# Patient Record
Sex: Female | Born: 1940 | Race: White | Hispanic: No | Smoking: Former smoker
Health system: Southern US, Community
[De-identification: ages and names within clinical notes are randomized; demographics above are authoritative.]

## PROBLEM LIST (undated history)

## (undated) DIAGNOSIS — Z923 Personal history of irradiation: Secondary | ICD-10-CM

## (undated) DIAGNOSIS — C50219 Malignant neoplasm of upper-inner quadrant of unspecified female breast: Secondary | ICD-10-CM

## (undated) DIAGNOSIS — F329 Major depressive disorder, single episode, unspecified: Secondary | ICD-10-CM

## (undated) DIAGNOSIS — F32A Depression, unspecified: Secondary | ICD-10-CM

## (undated) DIAGNOSIS — Z8679 Personal history of other diseases of the circulatory system: Secondary | ICD-10-CM

## (undated) DIAGNOSIS — R351 Nocturia: Secondary | ICD-10-CM

## (undated) DIAGNOSIS — R350 Frequency of micturition: Secondary | ICD-10-CM

## (undated) DIAGNOSIS — E785 Hyperlipidemia, unspecified: Secondary | ICD-10-CM

## (undated) DIAGNOSIS — R002 Palpitations: Secondary | ICD-10-CM

## (undated) DIAGNOSIS — Z853 Personal history of malignant neoplasm of breast: Secondary | ICD-10-CM

## (undated) DIAGNOSIS — C50919 Malignant neoplasm of unspecified site of unspecified female breast: Secondary | ICD-10-CM

## (undated) HISTORY — DX: Depression, unspecified: F32.A

## (undated) HISTORY — DX: Major depressive disorder, single episode, unspecified: F32.9

## (undated) HISTORY — DX: Hyperlipidemia, unspecified: E78.5

## (undated) HISTORY — DX: Malignant neoplasm of upper-inner quadrant of unspecified female breast: C50.219

---
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
## 1978-04-01 HISTORY — PX: HYSTEROSCOPY W/ ENDOMETRIAL ABLATION: SUR665

## 1998-12-14 ENCOUNTER — Ambulatory Visit (HOSPITAL_COMMUNITY): Admission: RE | Admit: 1998-12-14 | Discharge: 1998-12-14 | Payer: Self-pay | Admitting: Family Medicine

## 1998-12-14 ENCOUNTER — Encounter: Payer: Self-pay | Admitting: Family Medicine

## 2002-07-12 ENCOUNTER — Encounter: Payer: Self-pay | Admitting: Family Medicine

## 2002-07-12 ENCOUNTER — Encounter: Admission: RE | Admit: 2002-07-12 | Discharge: 2002-07-12 | Payer: Self-pay | Admitting: Family Medicine

## 2003-08-22 ENCOUNTER — Other Ambulatory Visit: Admission: RE | Admit: 2003-08-22 | Discharge: 2003-08-22 | Payer: Self-pay | Admitting: Family Medicine

## 2004-10-09 ENCOUNTER — Other Ambulatory Visit: Admission: RE | Admit: 2004-10-09 | Discharge: 2004-10-09 | Payer: Self-pay | Admitting: Family Medicine

## 2006-03-03 ENCOUNTER — Other Ambulatory Visit: Admission: RE | Admit: 2006-03-03 | Discharge: 2006-03-03 | Payer: Self-pay | Admitting: Family Medicine

## 2007-11-23 ENCOUNTER — Encounter: Payer: Self-pay | Admitting: Pulmonary Disease

## 2007-12-21 ENCOUNTER — Ambulatory Visit: Payer: Self-pay | Admitting: Pulmonary Disease

## 2007-12-21 DIAGNOSIS — R05 Cough: Secondary | ICD-10-CM

## 2007-12-21 DIAGNOSIS — E785 Hyperlipidemia, unspecified: Secondary | ICD-10-CM

## 2007-12-31 ENCOUNTER — Other Ambulatory Visit: Admission: RE | Admit: 2007-12-31 | Discharge: 2007-12-31 | Payer: Self-pay | Admitting: Family Medicine

## 2008-01-11 ENCOUNTER — Ambulatory Visit: Payer: Self-pay | Admitting: Pulmonary Disease

## 2008-08-26 ENCOUNTER — Encounter: Admission: RE | Admit: 2008-08-26 | Discharge: 2008-08-26 | Payer: Self-pay | Admitting: Family Medicine

## 2010-04-01 DIAGNOSIS — C50919 Malignant neoplasm of unspecified site of unspecified female breast: Secondary | ICD-10-CM

## 2010-04-01 DIAGNOSIS — Z923 Personal history of irradiation: Secondary | ICD-10-CM

## 2010-04-01 HISTORY — PX: BREAST LUMPECTOMY: SHX2

## 2010-04-01 HISTORY — DX: Personal history of irradiation: Z92.3

## 2010-04-01 HISTORY — DX: Malignant neoplasm of unspecified site of unspecified female breast: C50.919

## 2010-11-06 ENCOUNTER — Other Ambulatory Visit: Payer: Self-pay | Admitting: Family Medicine

## 2010-11-06 DIAGNOSIS — N6009 Solitary cyst of unspecified breast: Secondary | ICD-10-CM

## 2010-11-21 ENCOUNTER — Other Ambulatory Visit: Payer: Self-pay | Admitting: Family Medicine

## 2010-11-21 ENCOUNTER — Ambulatory Visit
Admission: RE | Admit: 2010-11-21 | Discharge: 2010-11-21 | Disposition: A | Discharge status: Home / Self Care | Payer: Medicare Other | Source: Ambulatory Visit | Attending: Family Medicine | Admitting: Family Medicine

## 2010-11-21 ENCOUNTER — Other Ambulatory Visit: Payer: Self-pay | Admitting: Diagnostic Radiology

## 2010-11-21 DIAGNOSIS — N6320 Unspecified lump in the left breast, unspecified quadrant: Secondary | ICD-10-CM

## 2010-11-21 DIAGNOSIS — C50219 Malignant neoplasm of upper-inner quadrant of unspecified female breast: Secondary | ICD-10-CM

## 2010-11-21 DIAGNOSIS — N6310 Unspecified lump in the right breast, unspecified quadrant: Secondary | ICD-10-CM

## 2010-11-21 DIAGNOSIS — C50211 Malignant neoplasm of upper-inner quadrant of right female breast: Secondary | ICD-10-CM | POA: Insufficient documentation

## 2010-11-21 DIAGNOSIS — N6009 Solitary cyst of unspecified breast: Secondary | ICD-10-CM

## 2010-11-21 HISTORY — DX: Malignant neoplasm of upper-inner quadrant of unspecified female breast: C50.219

## 2010-11-23 ENCOUNTER — Ambulatory Visit
Admission: RE | Admit: 2010-11-23 | Discharge: 2010-11-23 | Disposition: A | Discharge status: Home / Self Care | Payer: Medicare Other | Source: Ambulatory Visit | Attending: Family Medicine | Admitting: Family Medicine

## 2010-11-23 ENCOUNTER — Other Ambulatory Visit: Payer: Self-pay | Admitting: Family Medicine

## 2010-11-23 DIAGNOSIS — C50911 Malignant neoplasm of unspecified site of right female breast: Secondary | ICD-10-CM

## 2010-11-23 DIAGNOSIS — N6320 Unspecified lump in the left breast, unspecified quadrant: Secondary | ICD-10-CM

## 2010-11-27 ENCOUNTER — Ambulatory Visit
Admission: RE | Admit: 2010-11-27 | Discharge: 2010-11-27 | Disposition: A | Discharge status: Home / Self Care | Payer: Medicare Other | Source: Ambulatory Visit | Attending: Family Medicine | Admitting: Family Medicine

## 2010-11-27 DIAGNOSIS — C50911 Malignant neoplasm of unspecified site of right female breast: Secondary | ICD-10-CM

## 2010-11-27 MED ORDER — GADOBENATE DIMEGLUMINE 529 MG/ML IV SOLN
12.0000 mL | Freq: Once | INTRAVENOUS | Status: AC | PRN
  Administered 2010-11-27: 12 mL via INTRAVENOUS

## 2010-12-02 ENCOUNTER — Encounter (INDEPENDENT_AMBULATORY_CARE_PROVIDER_SITE_OTHER): Payer: Self-pay | Admitting: Surgery

## 2010-12-05 ENCOUNTER — Ambulatory Visit (INDEPENDENT_AMBULATORY_CARE_PROVIDER_SITE_OTHER): Payer: Medicare Other | Admitting: Surgery

## 2010-12-05 ENCOUNTER — Encounter (INDEPENDENT_AMBULATORY_CARE_PROVIDER_SITE_OTHER): Payer: Self-pay | Admitting: Surgery

## 2010-12-05 ENCOUNTER — Other Ambulatory Visit (INDEPENDENT_AMBULATORY_CARE_PROVIDER_SITE_OTHER): Payer: Self-pay | Admitting: Surgery

## 2010-12-05 VITALS — BP 98/56 | HR 58 | Temp 97.0°F | Ht 65.0 in | Wt 131.0 lb

## 2010-12-05 DIAGNOSIS — C50911 Malignant neoplasm of unspecified site of right female breast: Secondary | ICD-10-CM

## 2010-12-05 DIAGNOSIS — C50919 Malignant neoplasm of unspecified site of unspecified female breast: Secondary | ICD-10-CM

## 2010-12-05 DIAGNOSIS — C50219 Malignant neoplasm of upper-inner quadrant of unspecified female breast: Secondary | ICD-10-CM

## 2010-12-05 NOTE — Patient Instructions (Signed)
We will schedule you for surgery a lumpectomy and sentinel node. In the meantime we will try to arrange a visit with a medical and a radiation oncologist. If you have not heard from the cancer center by Monday call my nurse Jade at [phone]

## 2010-12-05 NOTE — Progress Notes (Signed)
NAME: [name]                                                                                      DOB: 08/07/1940 DATE: 12/05/2010               MRN: [id number]   CC:  Chief Complaint  Patient presents with  . Other    eval of right breast cancer     HPI: [name] is a 70 y.o.  female who presents with A newly diagnosed right breast cancer. The patient notes that she missed mammograms for about two years when her sister was diagnosed pancreatic cancer. She recently found a mass in the lateral aspect of the right breast, actually upper outer quadrant, and saw her medical doctor. A mammogram was done which showed an abnormality, biopsy was done which showed cancer, and an MRI was done which showed only the single lesion.  She's not had any other breast symptoms she did have a biopsy is soft and benign when she was 70 years old from the other breast. I have taken care of some of her friends and family members and she asked to see me for this although was originally scheduled to be seen at the breast multidisciplinary conference  PMH:  has a past medical history of Hyperlipidemia; Fatigue; Weight decrease; Blurred vision; Gum disease; Irregular heart beat; Poor appetite; Bruises easily; Breast lump; Forgetfulness; Weakness; and Depression.  PSH:  has past surgical history that includes Breast surgery (1962).  ALLERGIES:  Allergies  Allergen Reactions  . Penicillins     MEDICATIONS:  Current Outpatient Prescriptions  Medication Sig Dispense Refill  . pravastatin (PRAVACHOL) 40 MG tablet Take 40 mg by mouth daily.          ROS;Her review of systems is essentially negative except for a questionable history of gum disease blurred vision. Her heart beat poor appetite urinary symptoms easy bruisability and some depression.  EXAM: GENERAL: The patient is alert, oriented, and generally healthy-appearing, NAD. Mood and affect are normal.  HEENT: The head is normocephalic, the eyes  nonicteric, the pupils were round regular and equal. EOMs are normal. Pharynx normal. Dentition good.  NECK: The neck is supple and there are no masses or thyromegaly.  LUNGS: Normal respirations and clear to auscultation.  HEART: Regular rhythm, with no murmurs rubs or gallops. Pulses are intact carotid dorsalis pedis and posterior tibial. No significant varicosities are noted.  BREASTS: The breasts are symmetric in size and shape. There is ecchymosis in the upper outer quadrant of the right breast and a vague mass about 2 cm in size in the upper outer quadrant. The serious fairly hard I can tell bruising from actual mass.  Lymphatics: There is no axillary or supraclavicular  ABDOMEN: Soft, flat, and nontender. No masses or organomegaly is noted. No hernias are noted. Bowel sounds are normal.  EXTREMITIES: Good range of motion, no edema.  DATA REVIEWED: I have reviewed the mammogram films and reports, the MRI films and reports, the pathology report and slides. She was discussed with a breast conference last week.  IMPRESSION: Clinical Stage II Right breast  cancer, UOQ PLAN: I reviewed the patient's diagnosis with her and went over the different types of breast cancer and different treatment options including lumpectomy mastectomy sentinel node evaluation etc. I told her about the need for radiation therapy and the possible need for chemotherapy I discussed the fact that this is receptor negative.  I think all questions have been answered. We are going to tentatively set her up for a guidewire localized lumpectomy and sentinel node evaluation. We are also going to make preoperative visits with medical and radiation oncology.

## 2010-12-11 ENCOUNTER — Ambulatory Visit
Admission: RE | Admit: 2010-12-11 | Discharge: 2010-12-11 | Disposition: A | Discharge status: Home / Self Care | Payer: Medicare Other | Source: Ambulatory Visit | Attending: Radiation Oncology | Admitting: Radiation Oncology

## 2010-12-11 DIAGNOSIS — Z800 Family history of malignant neoplasm of digestive organs: Secondary | ICD-10-CM | POA: Insufficient documentation

## 2010-12-11 DIAGNOSIS — E785 Hyperlipidemia, unspecified: Secondary | ICD-10-CM | POA: Insufficient documentation

## 2010-12-11 DIAGNOSIS — Z510 Encounter for antineoplastic radiation therapy: Secondary | ICD-10-CM | POA: Insufficient documentation

## 2010-12-11 DIAGNOSIS — Z79899 Other long term (current) drug therapy: Secondary | ICD-10-CM | POA: Insufficient documentation

## 2010-12-11 DIAGNOSIS — C50219 Malignant neoplasm of upper-inner quadrant of unspecified female breast: Secondary | ICD-10-CM | POA: Insufficient documentation

## 2010-12-31 ENCOUNTER — Encounter (HOSPITAL_COMMUNITY)
Admission: RE | Admit: 2010-12-31 | Discharge: 2010-12-31 | Disposition: A | Discharge status: Home / Self Care | Payer: Medicare Other | Source: Ambulatory Visit | Attending: Surgery | Admitting: Surgery

## 2010-12-31 ENCOUNTER — Other Ambulatory Visit (INDEPENDENT_AMBULATORY_CARE_PROVIDER_SITE_OTHER): Payer: Self-pay | Admitting: Surgery

## 2010-12-31 DIAGNOSIS — Z9889 Other specified postprocedural states: Secondary | ICD-10-CM

## 2010-12-31 LAB — CBC
HCT: 39.2 % (ref 36.0–46.0)
MCH: 31.1 pg (ref 26.0–34.0)
MCV: 89.7 fL (ref 78.0–100.0)
Platelets: 190 10*3/uL (ref 150–400)
RBC: 4.37 MIL/uL (ref 3.87–5.11)
WBC: 6.7 10*3/uL (ref 4.0–10.5)

## 2010-12-31 LAB — URINALYSIS, ROUTINE W REFLEX MICROSCOPIC
Bilirubin Urine: NEGATIVE
Hgb urine dipstick: NEGATIVE
Ketones, ur: 15 mg/dL — AB
Specific Gravity, Urine: 1.025 (ref 1.005–1.030)
pH: 6 (ref 5.0–8.0)

## 2010-12-31 LAB — COMPREHENSIVE METABOLIC PANEL
Albumin: 3.8 g/dL (ref 3.5–5.2)
Alkaline Phosphatase: 75 U/L (ref 39–117)
BUN: 17 mg/dL (ref 6–23)
CO2: 30 mEq/L (ref 19–32)
Chloride: 103 mEq/L (ref 96–112)
Creatinine, Ser: 0.83 mg/dL (ref 0.50–1.10)
GFR calc Af Amer: 82 mL/min — ABNORMAL LOW (ref >90)
GFR calc non Af Amer: 70 mL/min — ABNORMAL LOW (ref >90)
Glucose, Bld: 77 mg/dL (ref 70–99)
Potassium: 3.9 mEq/L (ref 3.5–5.1)
Total Bilirubin: 1.2 mg/dL (ref 0.3–1.2)

## 2010-12-31 LAB — DIFFERENTIAL
Eosinophils Absolute: 0.2 10*3/uL (ref 0.0–0.7)
Eosinophils Relative: 2 % (ref 0–5)
Lymphocytes Relative: 31 % (ref 12–46)
Lymphs Abs: 2.1 10*3/uL (ref 0.7–4.0)
Monocytes Relative: 9 % (ref 3–12)
Neutrophils Relative %: 57 % (ref 43–77)

## 2010-12-31 LAB — SURGICAL PCR SCREEN
MRSA, PCR: NEGATIVE
Staphylococcus aureus: NEGATIVE

## 2011-01-01 ENCOUNTER — Other Ambulatory Visit: Payer: Self-pay | Admitting: Oncology

## 2011-01-01 ENCOUNTER — Encounter (HOSPITAL_BASED_OUTPATIENT_CLINIC_OR_DEPARTMENT_OTHER): Payer: Medicare Other | Admitting: Oncology

## 2011-01-01 DIAGNOSIS — C50419 Malignant neoplasm of upper-outer quadrant of unspecified female breast: Secondary | ICD-10-CM

## 2011-01-01 LAB — COMPREHENSIVE METABOLIC PANEL
AST: 18 U/L (ref 0–37)
Alkaline Phosphatase: 66 U/L (ref 39–117)
BUN: 14 mg/dL (ref 6–23)
Glucose, Bld: 92 mg/dL (ref 70–99)
Sodium: 141 mEq/L (ref 135–145)
Total Bilirubin: 1.1 mg/dL (ref 0.3–1.2)
Total Protein: 6.9 g/dL (ref 6.0–8.3)

## 2011-01-01 LAB — CBC WITH DIFFERENTIAL/PLATELET
EOS%: 2.2 % (ref 0.0–7.0)
Eosinophils Absolute: 0.1 10*3/uL (ref 0.0–0.5)
LYMPH%: 33.7 % (ref 14.0–49.7)
MCH: 32 pg (ref 25.1–34.0)
MCV: 92 fL (ref 79.5–101.0)
MONO%: 6.6 % (ref 0.0–14.0)
NEUT#: 2.7 10*3/uL (ref 1.5–6.5)
Platelets: 175 10*3/uL (ref 145–400)
RBC: 4.03 10*6/uL (ref 3.70–5.45)

## 2011-01-03 ENCOUNTER — Ambulatory Visit
Admission: RE | Admit: 2011-01-03 | Discharge: 2011-01-03 | Disposition: A | Discharge status: Home / Self Care | Payer: Medicare Other | Source: Ambulatory Visit | Attending: Surgery | Admitting: Surgery

## 2011-01-03 ENCOUNTER — Encounter (INDEPENDENT_AMBULATORY_CARE_PROVIDER_SITE_OTHER): Payer: Self-pay | Admitting: Surgery

## 2011-01-03 ENCOUNTER — Ambulatory Visit (HOSPITAL_COMMUNITY)
Admission: RE | Admit: 2011-01-03 | Discharge: 2011-01-03 | Disposition: A | Discharge status: Home / Self Care | Payer: Medicare Other | Source: Ambulatory Visit | Attending: Surgery | Admitting: Surgery

## 2011-01-03 ENCOUNTER — Other Ambulatory Visit (INDEPENDENT_AMBULATORY_CARE_PROVIDER_SITE_OTHER): Payer: Self-pay | Admitting: Surgery

## 2011-01-03 ENCOUNTER — Telehealth (INDEPENDENT_AMBULATORY_CARE_PROVIDER_SITE_OTHER): Payer: Self-pay | Admitting: General Surgery

## 2011-01-03 DIAGNOSIS — C50419 Malignant neoplasm of upper-outer quadrant of unspecified female breast: Secondary | ICD-10-CM | POA: Insufficient documentation

## 2011-01-03 DIAGNOSIS — C50919 Malignant neoplasm of unspecified site of unspecified female breast: Secondary | ICD-10-CM

## 2011-01-03 DIAGNOSIS — Z01810 Encounter for preprocedural cardiovascular examination: Secondary | ICD-10-CM | POA: Insufficient documentation

## 2011-01-03 DIAGNOSIS — Z01818 Encounter for other preprocedural examination: Secondary | ICD-10-CM | POA: Insufficient documentation

## 2011-01-03 DIAGNOSIS — C50911 Malignant neoplasm of unspecified site of right female breast: Secondary | ICD-10-CM

## 2011-01-03 DIAGNOSIS — Z01812 Encounter for preprocedural laboratory examination: Secondary | ICD-10-CM | POA: Insufficient documentation

## 2011-01-03 HISTORY — PX: BREAST LUMPECTOMY W/ NEEDLE LOCALIZATION: SHX1266

## 2011-01-03 MED ORDER — TECHNETIUM TC 99M SULFUR COLLOID FILTERED
1.0000 | Freq: Once | INTRAVENOUS | Status: AC | PRN
  Administered 2011-01-03: 1 via INTRADERMAL

## 2011-01-03 NOTE — Telephone Encounter (Signed)
Message copied by Liliana Cline on Thu Jan 03, 2011  8:58 AM ------      Message from: Currie Paris      Created: Wed Jan 02, 2011  1:24 PM       Sorry should have been ok for surgery

## 2011-01-03 NOTE — Telephone Encounter (Signed)
Labs okay for surgery faxed to pre-op.  

## 2011-01-07 ENCOUNTER — Encounter (INDEPENDENT_AMBULATORY_CARE_PROVIDER_SITE_OTHER): Payer: Self-pay | Admitting: Surgery

## 2011-01-07 ENCOUNTER — Telehealth (INDEPENDENT_AMBULATORY_CARE_PROVIDER_SITE_OTHER): Payer: Self-pay | Admitting: General Surgery

## 2011-01-07 NOTE — Telephone Encounter (Signed)
Patient made aware that her margins were ok and her lymph nodes were negative. Patient will follow up at her scheduled appt and call with any problems prior.

## 2011-01-07 NOTE — Telephone Encounter (Signed)
Message copied by Liliana Cline on Mon Jan 07, 2011  9:35 AM ------      Message from: Currie Paris      Created: Mon Jan 07, 2011  5:57 AM       Tell the patient that her margins are OK and her lymph nodes are negative. I will discuss in detail in the office.

## 2011-01-09 ENCOUNTER — Ambulatory Visit
Admission: RE | Admit: 2011-01-09 | Discharge: 2011-01-09 | Disposition: A | Discharge status: Home / Self Care | Payer: Medicare Other | Source: Ambulatory Visit | Attending: Radiation Oncology | Admitting: Radiation Oncology

## 2011-01-09 DIAGNOSIS — Z171 Estrogen receptor negative status [ER-]: Secondary | ICD-10-CM | POA: Insufficient documentation

## 2011-01-09 DIAGNOSIS — C50919 Malignant neoplasm of unspecified site of unspecified female breast: Secondary | ICD-10-CM | POA: Insufficient documentation

## 2011-01-09 NOTE — Op Note (Signed)
NAMETANISHIA, [name] NO.:  [id number]  MEDICAL RECORD NO.:  [id number]  LOCATION:  [facility]                         FACILITY:  [facility]  PHYSICIAN:  Currie Paris, M.D.DATE OF BIRTH:  08-09-1940  DATE OF PROCEDURE:  01/03/2011 DATE OF DISCHARGE:                              OPERATIVE REPORT   PREOPERATIVE DIAGNOSIS:  Carcinoma of right breast upper outer quadrant, clinical stage I.  POSTOPERATIVE DIAGNOSIS:  Carcinoma of right breast upper outer quadrant, clinical stage I.  PROCEDURE:  Needle guided right lumpectomy with blue dye injection and axillary sentinel lymph node biopsy.  SURGEON:  Currie Paris, MD  ANESTHESIA:  General.  CLINICAL HISTORY:  This patient was recently diagnosed with a right breast cancer.  After discussion with the patient she elected to proceed to do lumpectomy and sentinel node evaluation.  She was seen preoperatively by Medical Oncology and decision for chemo was going to be based on final pathology.  DESCRIPTION OF PROCEDURE:  I saw the patient in the holding area and she had no further questions.  We confirmed the plans for the procedure as noted above.  I initialed the right breast as the operative side and reviewed the localizing films and noted to the radiologist had marked an X on the breast over the area of the tumor.  The patient was taken to the operating room, after satisfactory general (LMA) anesthesia had been obtained, I did the time-out.  The right breast was then injected with 5 mL of dilute methylene blue and massaged in.  A full prep and drape was done.  The area of the X I could palpate what I thought was the mass and it felt fairly close to the skin and on the ultrasound I thought it looked fairly close to the skin, so I made a curvilinear elliptical incision starting just lateral to where the guidewire entered and centered on the "X ".  I then raised a small skin flaps superiorly and inferiorly  and then divided the breast tissue towards the chest wall first superiorly, then medially and then inferiorly, and I got down to the chest wall in all three sides, I was able to come underneath the mass and finally disconnected laterally.  By palpation I was around the mass in all directions and I thought I had adequate margins grossly.  Special mammogram showed the mass to be apparently in the center of the specimen.  I spent several minutes irrigating, making sure everything was dry.  I freed the breast up from the underlying muscle and put a small clips on to mark all of the margins using two clips on the muscle.  I closed the breast deep and then a little bit more superficial.  I elevated the skin off of the breast so that there was less tethering towards the nipple-areolar complex which was being pulled a little bit up because of the skin that I had taken off.  The skin was then closed with 4-0 Monocryl subcuticular and Dermabond.  Attention was turned to the axilla and a hot area identified.  I made a transverse incision, divided the subcutaneous tissue and placed a self- retaining retractor.  I almost medially identified a blue lymphatic which took me to a blue lymph node which was excised with cautery and I thought the tissue actually contained 2 separate nodes, although I was not sure this was not one somewhat dumbbell shaped node.  Both were bright blue and had counts of about 1200.  The tissue was soft and I thought clinically negative.  Attention was turned back to the axilla and I palpated carefully, did not feel any other enlarged lymph nodes, did not see any other blue dye and there were no hot areas.  I put about 10 mL of Marcaine in here and closed with 3-0 Vicryl for Monocryl, subcuticular and Dermabond.  The patient tolerated the procedure well.  There were no complications. All counts were correct.     Currie Paris, M.D.     CJS/MEDQ  D:   01/03/2011  T:  01/03/2011  Job:  [id number]  cc:   Molly Maduro A. Nicholos Johns, M.D. Barbaraann Share, MD,FCCP Lowella Dell, M.D.  Electronically Signed by Cyndia Bent M.D. on 01/09/2011 07:14:40 PM

## 2011-01-11 ENCOUNTER — Telehealth (INDEPENDENT_AMBULATORY_CARE_PROVIDER_SITE_OTHER): Payer: Self-pay | Admitting: General Surgery

## 2011-01-11 NOTE — Telephone Encounter (Signed)
Called patient back to make her appt. I have her scheduled for 01/22/11. Patient has radiation mapping on 01/21/11 because she has refused chemo so she is continuing with radiation. She will call with any problems and follow up at scheduled appt.

## 2011-01-15 ENCOUNTER — Other Ambulatory Visit (HOSPITAL_COMMUNITY)
Admission: RE | Admit: 2011-01-15 | Discharge: 2011-01-15 | Disposition: A | Discharge status: Home / Self Care | Payer: Medicare Other | Source: Ambulatory Visit | Attending: Family Medicine | Admitting: Family Medicine

## 2011-01-15 ENCOUNTER — Other Ambulatory Visit: Payer: Self-pay | Admitting: Family Medicine

## 2011-01-15 DIAGNOSIS — Z124 Encounter for screening for malignant neoplasm of cervix: Secondary | ICD-10-CM | POA: Insufficient documentation

## 2011-01-24 ENCOUNTER — Ambulatory Visit (INDEPENDENT_AMBULATORY_CARE_PROVIDER_SITE_OTHER): Payer: Medicare Other | Admitting: Surgery

## 2011-01-24 ENCOUNTER — Encounter (INDEPENDENT_AMBULATORY_CARE_PROVIDER_SITE_OTHER): Payer: Self-pay | Admitting: Surgery

## 2011-01-24 VITALS — BP 92/60 | HR 64 | Temp 99.1°F | Resp 12 | Ht 65.0 in | Wt 130.6 lb

## 2011-01-24 DIAGNOSIS — C50219 Malignant neoplasm of upper-inner quadrant of unspecified female breast: Secondary | ICD-10-CM

## 2011-01-24 NOTE — Progress Notes (Signed)
[name]    [id number] 01/24/2011    07-10-1940   CC: Post op lumpectomy/SLN  HPI: The patient returns for post op follow-up. She underwent a right lumpectomy and SLN on 01/03/2011. Over all she feels that she is doing well. She is ready to start radiation  PE: The incision is healing nicely and there is no evidence of infection or hematoma.  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 One SLN -> negative  IMPRESSION: Patient doing well.   PLAN: Her next visit will be in about two months, when she has finished radiation.Marland Kitchen

## 2011-01-24 NOTE — Patient Instructions (Signed)
I need to see you about three weeks after you finish your radiation

## 2011-01-29 ENCOUNTER — Encounter (INDEPENDENT_AMBULATORY_CARE_PROVIDER_SITE_OTHER): Payer: Self-pay | Admitting: Surgery

## 2011-01-29 ENCOUNTER — Ambulatory Visit (INDEPENDENT_AMBULATORY_CARE_PROVIDER_SITE_OTHER): Payer: Medicare Other | Admitting: Surgery

## 2011-01-29 VITALS — BP 88/60 | HR 60 | Temp 97.4°F | Resp 12 | Ht 65.0 in | Wt 130.4 lb

## 2011-01-29 DIAGNOSIS — C50219 Malignant neoplasm of upper-inner quadrant of unspecified female breast: Secondary | ICD-10-CM

## 2011-01-29 MED ORDER — DOXYCYCLINE HYCLATE 100 MG PO CAPS: 100.0000 mg | ORAL_CAPSULE | Freq: Two times a day (BID) | ORAL | Status: AC

## 2011-01-29 NOTE — Patient Instructions (Signed)
Begin your antibiotics today.  Follow-up with Dr. Jamey Ripa in 3 days.

## 2011-01-29 NOTE — Progress Notes (Signed)
URGENT OFFICE  Patient of Dr. Jamey Ripa - s/p right lumpectomy SLNB on 01/03/11.  She was scheduled to start radiation today, but several days ago, she began noticing some tenderness behind her right nipple and some mild erythema.  The right breast feels "heavy" compared to the left breast.  Dr. Dayton Scrape sent her over to be evaluated.  Filed Vitals:   01/29/11 1555  BP: 88/60  Pulse: 60  Temp: 97.4 F (36.3 C)  Resp: 12    On examination, the patient has a well-healed incision in the right upper outer quadrant.  There is some mild erythema around the nipple extending into the lower inner quadrant.  There seems to be some fullness to the lumpectomy site.  I attempted to aspirate a possible seroma, but I was unable to obtain any fluid.  We will start her on an empiric course of Doxycycline, as she is allergic to penicillin.  I will have her come back to see Dr. Jamey Ripa on Friday.

## 2011-01-30 ENCOUNTER — Encounter: Payer: Self-pay | Admitting: *Deleted

## 2011-01-31 ENCOUNTER — Encounter (INDEPENDENT_AMBULATORY_CARE_PROVIDER_SITE_OTHER): Payer: Self-pay | Admitting: Surgery

## 2011-01-31 ENCOUNTER — Ambulatory Visit (INDEPENDENT_AMBULATORY_CARE_PROVIDER_SITE_OTHER): Payer: Medicare Other | Admitting: Surgery

## 2011-01-31 VITALS — BP 92/64 | HR 68 | Temp 97.6°F | Resp 14 | Ht 65.0 in | Wt 130.0 lb

## 2011-01-31 DIAGNOSIS — C50219 Malignant neoplasm of upper-inner quadrant of unspecified female breast: Secondary | ICD-10-CM

## 2011-01-31 NOTE — Patient Instructions (Signed)
Try takin two alleve twice daily for a few days, then one twice daily. Call me on Monday to let me know how you are doing

## 2011-01-31 NOTE — Progress Notes (Signed)
The patient comes back for followup. She developed some redness of her breast and pain around the nipple areolar area and the redness extending from their medially. She had a lumpectomy in the upper outer quadrant of the right breast.  She saw my partner, Dr.Tsuei, a few days ago and he started her on some doxycycline 100 mg p.o. b.i.d. she notes that she is having a little bit less pain and tenderness since that started. She is not having any fever.  Exam: There is erythema of the breast extending from the nipple areola complex medially towards the sternum. There is not appear to be any fluid collections. Both the lumpectomy and the lymph node incisions are healing nicely. She is tender especially in the nipple areolar area.  Impression: Mild erythema of the breast of uncertain etiology. I don't know if this is related to her sentinel node injection or some development of cellulitis. Nevertheless, it is slightly improved on doxycycline.  Plan: She'll continue doxycycline and common Monday to let me know how she is doing. I recommended that she start taking Aleve, two b.i.d., for a few days and then one b.i.d.

## 2011-02-04 ENCOUNTER — Encounter (INDEPENDENT_AMBULATORY_CARE_PROVIDER_SITE_OTHER): Payer: Self-pay | Admitting: Surgery

## 2011-02-04 ENCOUNTER — Ambulatory Visit: Payer: Medicare Other

## 2011-02-04 ENCOUNTER — Ambulatory Visit (INDEPENDENT_AMBULATORY_CARE_PROVIDER_SITE_OTHER): Payer: Medicare Other | Admitting: Surgery

## 2011-02-04 VITALS — BP 112/76 | HR 68 | Temp 97.4°F | Resp 16 | Ht 65.0 in | Wt 131.0 lb

## 2011-02-04 DIAGNOSIS — Z9889 Other specified postprocedural states: Secondary | ICD-10-CM

## 2011-02-04 MED ORDER — LEVOFLOXACIN 250 MG PO TABS: 250.0000 mg | ORAL_TABLET | Freq: Every day | ORAL | Status: DC

## 2011-02-04 NOTE — Progress Notes (Signed)
The patient comes back for followup. She developed some redness of her breast and pain around the nipple areolar area and the redness extending from their medially. She had a lumpectomy in the upper outer quadrant of the right breast.  She saw me a few days ago and thought it was improving . She hasn't really improved since I saw her. She is on Doxy. Exam: There is erythema of the breast extending from the nipple areola complex medially towards the sternum. There is not appear to be any fluid collections. Both the lumpectomy and the lymph node incisions are healing nicely. She is tender especially in the nipple areolar area.The erythema seems less than last week  Impression: Mild erythema of the breast of uncertain etiology. I don't know if this is related to her sentinel node injection or some development of cellulitis. Nevertheless, it is slightly improved on doxycycline.  Plan: We will try Levoquin. I think she can start her radiation

## 2011-02-04 NOTE — Patient Instructions (Signed)
We will change your antibiotic to Levoquin generic. I need to recheck you next week

## 2011-02-05 ENCOUNTER — Ambulatory Visit: Payer: Medicare Other

## 2011-02-11 ENCOUNTER — Ambulatory Visit: Payer: Medicare Other

## 2011-02-12 ENCOUNTER — Ambulatory Visit (INDEPENDENT_AMBULATORY_CARE_PROVIDER_SITE_OTHER): Payer: Medicare Other | Admitting: Surgery

## 2011-02-12 ENCOUNTER — Ambulatory Visit: Payer: Medicare Other

## 2011-02-12 ENCOUNTER — Encounter (INDEPENDENT_AMBULATORY_CARE_PROVIDER_SITE_OTHER): Payer: Self-pay | Admitting: Surgery

## 2011-02-12 VITALS — BP 98/60 | HR 60 | Temp 97.6°F | Resp 16 | Ht 65.0 in | Wt 133.0 lb

## 2011-02-12 DIAGNOSIS — Z09 Encounter for follow-up examination after completed treatment for conditions other than malignant neoplasm: Secondary | ICD-10-CM

## 2011-02-12 MED ORDER — LEVOFLOXACIN 250 MG PO TABS: 250.0000 mg | ORAL_TABLET | Freq: Every day | ORAL | Status: AC

## 2011-02-12 NOTE — Progress Notes (Signed)
The patient comes back for followup. She developed some redness of her breast and pain around the nipple areolar area and the redness extending from their medially. She had a lumpectomy in the upper outer quadrant of the right breast.  She improved on Levaquin and two days ago was not tender or red. However, she has been off the med for two days and today the erythema and discomfort have returned. Exam: There is erythema of the breast extending from the nipple areola complex medially towards the sternum. There does not appear to be any fluid collections. Both the lumpectomy and the lymph node incisions are healing nicely. She is tender especially in the nipple areolar area.  I did an ultrasound and there is no significant fluid collection or abscess Impression: Mild erythema of the breast of uncertain etiology Since it responded to Levaquin it may be a low grade cellulitis, but she has never had systemic symptoms Plan:We will give another course of Levaquin, this time for two weeks. I still think she can start radiation

## 2011-02-12 NOTE — Patient Instructions (Signed)
You should restart the Levaquin and continue it for too weeks. I think it is OK to start radiation

## 2011-02-13 ENCOUNTER — Ambulatory Visit
Admission: RE | Admit: 2011-02-13 | Discharge: 2011-02-13 | Disposition: A | Discharge status: Home / Self Care | Payer: Medicare Other | Source: Ambulatory Visit | Attending: Radiation Oncology | Admitting: Radiation Oncology

## 2011-02-13 DIAGNOSIS — C50219 Malignant neoplasm of upper-inner quadrant of unspecified female breast: Secondary | ICD-10-CM

## 2011-02-13 NOTE — Progress Notes (Signed)
Pt was on break from tx due to breast infection. [place] yesterday which showed no abscesses or cysts, just scar tissue, infection per Dr Jamey Ripa. On Levaquin x 2 wks.

## 2011-02-13 NOTE — Progress Notes (Signed)
[facility] Rad[facility] Oncology Weekly Treatment Note    Name: [name] Date: 02/13/2011 MRN: [id number] DOB: [birth year]  Status:outpatient    Current dose: 250cGy  Current fraction:1  Planned dose:4250cGy  Planned fraction:17   MEDICATIONS:Current outpatient prescriptions:levofloxacin (LEVAQUIN) 250 MG tablet, Take 1 tablet (250 mg total) by mouth daily., Disp: 14 tablet, Rfl: 0;  naproxen sodium (ANAPROX) 220 MG tablet, Take 220 mg by mouth 1 day or 1 dose.  , Disp: , Rfl: ;  pravastatin (PRAVACHOL) 40 MG tablet, Take 40 mg by mouth daily.  , Disp: , Rfl:    ALLERGIES: Penicillins    NARRATIVE: [name] was seen today for weekly treatment management. The chart was checked and port films images were reviewed. We delayed initiation of radiation therapy because of the central breast erythema and discomfort she was evaluated by Dr. Jamey Ripa who is treating her for cellulitis. Of note, is that she did improve on Levaquin and Dr. Jamey Ripa plans on continuing this for a total of 2 weeks.  PHYSICAL EXAMINATION: vitals were not taken for this visit.        There is mild erythema of the central breast and slight tenderness on palpation. No masses are appreciated.    ASSESSMENT: Patient tolerating treatments well.    PLAN: Continue treatment as planned.

## 2011-02-14 ENCOUNTER — Ambulatory Visit
Admission: RE | Admit: 2011-02-14 | Discharge: 2011-02-14 | Disposition: A | Discharge status: Home / Self Care | Payer: Medicare Other | Source: Ambulatory Visit | Attending: Radiation Oncology | Admitting: Radiation Oncology

## 2011-02-15 ENCOUNTER — Ambulatory Visit
Admission: RE | Admit: 2011-02-15 | Discharge: 2011-02-15 | Disposition: A | Discharge status: Home / Self Care | Payer: Medicare Other | Source: Ambulatory Visit | Attending: Radiation Oncology | Admitting: Radiation Oncology

## 2011-02-16 ENCOUNTER — Ambulatory Visit
Admission: RE | Admit: 2011-02-16 | Discharge: 2011-02-16 | Disposition: A | Discharge status: Home / Self Care | Payer: Medicare Other | Source: Ambulatory Visit | Attending: Radiation Oncology | Admitting: Radiation Oncology

## 2011-02-18 ENCOUNTER — Ambulatory Visit
Admission: RE | Admit: 2011-02-18 | Discharge: 2011-02-18 | Disposition: A | Discharge status: Home / Self Care | Payer: Medicare Other | Source: Ambulatory Visit | Attending: Radiation Oncology | Admitting: Radiation Oncology

## 2011-02-18 VITALS — Wt 132.3 lb

## 2011-02-18 DIAGNOSIS — C50219 Malignant neoplasm of upper-inner quadrant of unspecified female breast: Secondary | ICD-10-CM

## 2011-02-18 NOTE — Progress Notes (Signed)
PATIENT POINTS OUT RED AREA ON BREAST THAT HAS BEEN THERE FROM INFECTION, TAKING LEVAQUIN NOW FOR THAT

## 2011-02-18 NOTE — Progress Notes (Signed)
[facility] Radiation Oncology Weekly Treatment Note    Name: CALEB PRIGMORE Date: 02/18/2011 MRN: [id number] DOB: [birth year]  Status:outpatient    Current dose: 1250cGy  Current fraction:5  Planned dose:5000cGy  Planned fraction:20   ALLERGIES: Penicillins    NARRATIVE: Little Ishikawa Trego was seen today for weekly treatment management. The chart was checked and port films images were reviewed. She continues with her antibiotics and has 9 more days  PHYSICAL EXAMINATION: weight is 132 lb 4.8 oz (60.011 kg).         There remains a central erythema along the right breast which is unchanged. No discreet masses are appreciated.    ASSESSMENT: Patient tolerating treatments well.    PLAN: Continue treatment as planned.

## 2011-02-19 ENCOUNTER — Ambulatory Visit
Admission: RE | Admit: 2011-02-19 | Discharge: 2011-02-19 | Disposition: A | Discharge status: Home / Self Care | Payer: Medicare Other | Source: Ambulatory Visit | Attending: Radiation Oncology | Admitting: Radiation Oncology

## 2011-02-20 ENCOUNTER — Ambulatory Visit
Admission: RE | Admit: 2011-02-20 | Discharge: 2011-02-20 | Disposition: A | Discharge status: Home / Self Care | Payer: Medicare Other | Source: Ambulatory Visit | Attending: Radiation Oncology | Admitting: Radiation Oncology

## 2011-02-25 ENCOUNTER — Ambulatory Visit
Admission: RE | Admit: 2011-02-25 | Discharge: 2011-02-25 | Disposition: A | Discharge status: Home / Self Care | Payer: Medicare Other | Source: Ambulatory Visit | Attending: Radiation Oncology | Admitting: Radiation Oncology

## 2011-02-25 VITALS — Wt 131.2 lb

## 2011-02-25 DIAGNOSIS — C50219 Malignant neoplasm of upper-inner quadrant of unspecified female breast: Secondary | ICD-10-CM

## 2011-02-25 MED ORDER — PROMETHAZINE HCL 25 MG RE SUPP: 25.0000 mg | Freq: Four times a day (QID) | RECTAL | Status: DC | PRN

## 2011-02-25 NOTE — Progress Notes (Signed)
STATES "THE INFECTION APPEARS TO BE CLEARING UP".  FINISHED ANTIBIOTIC YESTERDAY.  SKIN PINK

## 2011-02-25 NOTE — Progress Notes (Signed)
Weekly Management Note:  Site:R breast Current Dose:  2000  cGy Projected Dose: 5000  cGy  Narrative: The patient is seen today for routine under treatment assessment. CBCT/MVCT images/port films were reviewed. The chart was reviewed.   She still has right breast discomfort but is otherwise doing well. She is using Biafine cream when necessary. She finished her antibiotics, she does have some nausea which she attributes to her recent antibiotics. She elected prescription for Phenergan suppositories to have on hand.  Physical Examination: There were no vitals filed for this visit..  Weight: 131 lb 3.2 oz (59.512 kg). There is central erythema the right breast with some thickening but no change from last week.  Impression: Tolerating radiation therapy well.  Plan: Continue radiation therapy as planned. I will forward her a prescription for Phenergan suppositories, 25 mg dispense #5, no refills.

## 2011-02-26 ENCOUNTER — Ambulatory Visit
Admission: RE | Admit: 2011-02-26 | Discharge: 2011-02-26 | Disposition: A | Discharge status: Home / Self Care | Payer: Medicare Other | Source: Ambulatory Visit | Attending: Radiation Oncology | Admitting: Radiation Oncology

## 2011-02-27 ENCOUNTER — Ambulatory Visit
Admission: RE | Admit: 2011-02-27 | Discharge: 2011-02-27 | Disposition: A | Discharge status: Home / Self Care | Payer: Medicare Other | Source: Ambulatory Visit | Attending: Radiation Oncology | Admitting: Radiation Oncology

## 2011-02-28 ENCOUNTER — Ambulatory Visit
Admission: RE | Admit: 2011-02-28 | Discharge: 2011-02-28 | Disposition: A | Discharge status: Home / Self Care | Payer: Medicare Other | Source: Ambulatory Visit | Attending: Radiation Oncology | Admitting: Radiation Oncology

## 2011-03-01 ENCOUNTER — Ambulatory Visit
Admission: RE | Admit: 2011-03-01 | Discharge: 2011-03-01 | Disposition: A | Discharge status: Home / Self Care | Payer: Medicare Other | Source: Ambulatory Visit | Attending: Radiation Oncology | Admitting: Radiation Oncology

## 2011-03-04 ENCOUNTER — Ambulatory Visit
Admission: RE | Admit: 2011-03-04 | Discharge: 2011-03-04 | Disposition: A | Discharge status: Home / Self Care | Payer: Medicare Other | Source: Ambulatory Visit | Attending: Radiation Oncology | Admitting: Radiation Oncology

## 2011-03-04 DIAGNOSIS — C50219 Malignant neoplasm of upper-inner quadrant of unspecified female breast: Secondary | ICD-10-CM

## 2011-03-04 NOTE — Progress Notes (Signed)
Weekly Management Note:  Site:R Breast Current Dose:  3250  cGy Projected Dose: 5000  cGy  Narrative: The patient is seen today for routine under treatment assessment. CBCT/MVCT images/port films were reviewed. The chart was reviewed.   No complaints today. She is using Biafine cream.  Physical Examination: There were no vitals filed for this visit..  Weight:  . There is moderate erythema the skin along the central breast. This may be more radiation dermatitis rather than her pretreatment erythema.  Impression: Tolerating radiation therapy well.  Plan: Continue radiation therapy as planned.

## 2011-03-04 NOTE — Progress Notes (Signed)
APPETITE NOT AS GOOD AS USUAL, BUT NO WEIGHT LOSS.  NOTED [name] AT [facility]

## 2011-03-05 ENCOUNTER — Ambulatory Visit
Admission: RE | Admit: 2011-03-05 | Discharge: 2011-03-05 | Disposition: A | Discharge status: Home / Self Care | Payer: Medicare Other | Source: Ambulatory Visit | Attending: Radiation Oncology | Admitting: Radiation Oncology

## 2011-03-06 ENCOUNTER — Ambulatory Visit
Admission: RE | Admit: 2011-03-06 | Discharge: 2011-03-06 | Disposition: A | Discharge status: Home / Self Care | Payer: Medicare Other | Source: Ambulatory Visit | Attending: Radiation Oncology | Admitting: Radiation Oncology

## 2011-03-07 ENCOUNTER — Ambulatory Visit
Admission: RE | Admit: 2011-03-07 | Discharge: 2011-03-07 | Disposition: A | Discharge status: Home / Self Care | Payer: Medicare Other | Source: Ambulatory Visit | Attending: Radiation Oncology | Admitting: Radiation Oncology

## 2011-03-07 ENCOUNTER — Ambulatory Visit: Admission: RE | Admit: 2011-03-07 | Payer: Medicare Other | Source: Ambulatory Visit | Admitting: Radiation Oncology

## 2011-03-08 ENCOUNTER — Ambulatory Visit
Admission: RE | Admit: 2011-03-08 | Discharge: 2011-03-08 | Disposition: A | Discharge status: Home / Self Care | Payer: Medicare Other | Source: Ambulatory Visit | Attending: Radiation Oncology | Admitting: Radiation Oncology

## 2011-03-11 ENCOUNTER — Ambulatory Visit
Admission: RE | Admit: 2011-03-11 | Discharge: 2011-03-11 | Disposition: A | Discharge status: Home / Self Care | Payer: Medicare Other | Source: Ambulatory Visit | Attending: Radiation Oncology | Admitting: Radiation Oncology

## 2011-03-11 VITALS — Wt 132.0 lb

## 2011-03-11 DIAGNOSIS — C50219 Malignant neoplasm of upper-inner quadrant of unspecified female breast: Secondary | ICD-10-CM

## 2011-03-11 DIAGNOSIS — Z800 Family history of malignant neoplasm of digestive organs: Secondary | ICD-10-CM | POA: Insufficient documentation

## 2011-03-11 DIAGNOSIS — Z510 Encounter for antineoplastic radiation therapy: Secondary | ICD-10-CM | POA: Insufficient documentation

## 2011-03-11 DIAGNOSIS — Z79899 Other long term (current) drug therapy: Secondary | ICD-10-CM | POA: Insufficient documentation

## 2011-03-11 DIAGNOSIS — E785 Hyperlipidemia, unspecified: Secondary | ICD-10-CM | POA: Insufficient documentation

## 2011-03-11 NOTE — Progress Notes (Signed)
C/O BEING MORE FATIQUED.  SKIN BRIGHT RED AND SORE TO [facility]

## 2011-03-11 NOTE — Progress Notes (Signed)
Weekly Management Note:  Site:R Breast Current Dose:  4500  cGy Projected Dose: 5000  cGy  Narrative: The patient is seen today for routine under treatment assessment. CBCT/MVCT images/port films were reviewed. The chart was reviewed.   No new complaints today.  Physical Examination: There were no vitals filed for this visit..  Weight: 132 lb (59.875 kg). There is generalized erythema with no areas of desquamation.  Impression: Tolerating radiation therapy well.  Plan: Continue radiation therapy as planned. She will finish her radiation therapy this Wednesday. She will then return for a followup visit in one month.

## 2011-03-12 ENCOUNTER — Ambulatory Visit: Payer: Medicare Other

## 2011-03-12 ENCOUNTER — Ambulatory Visit
Admission: RE | Admit: 2011-03-12 | Discharge: 2011-03-12 | Disposition: A | Discharge status: Home / Self Care | Payer: Medicare Other | Source: Ambulatory Visit | Attending: Radiation Oncology | Admitting: Radiation Oncology

## 2011-03-13 ENCOUNTER — Ambulatory Visit
Admission: RE | Admit: 2011-03-13 | Discharge: 2011-03-13 | Disposition: A | Discharge status: Home / Self Care | Payer: Medicare Other | Source: Ambulatory Visit | Attending: Radiation Oncology | Admitting: Radiation Oncology

## 2011-03-15 ENCOUNTER — Encounter (INDEPENDENT_AMBULATORY_CARE_PROVIDER_SITE_OTHER): Payer: Self-pay | Admitting: Surgery

## 2011-03-15 ENCOUNTER — Encounter: Payer: Self-pay | Admitting: Radiation Oncology

## 2011-03-15 NOTE — Progress Notes (Signed)
Ms Broaden underwent virtual simulation for her right breast electron beam boost.  She was set up RAO en face.  This was performed on 03/04/2011.  One custom block was constructed to shield the normal surrounding structures.  A special port plan was requested.  I prescribed a dose of 750 cGy in 3 sessions utilizing 15 MeV electrons.    ______________________________ Maryln Gottron, M.D. RJM/MEDQ  D:  03/15/2011  T:  03/15/2011  Job:  [id number]

## 2011-03-15 NOTE — Progress Notes (Signed)
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 Right breast boost 750 cGy 3 sessions   BEAMS/ENERGY: 6 MV photons tangential fields to the right breast. 15 MEV electrons, right breast boost   NARRATIVE: The patient tolerated treatment well. We suspected she had some type of allergic reaction to her sentinel lymph node dye with the presence of central right breast erythema on initiation of radiation therapy. This did not progress during her course of therapy. She does not have any areas of desquamation by completion of therapy. She used Radioplex gel during her course of therapy.  PLAN: Routine followup in one month. Patient instructed to call if questions or worsening complaints in interim.

## 2011-03-28 ENCOUNTER — Encounter (INDEPENDENT_AMBULATORY_CARE_PROVIDER_SITE_OTHER): Payer: Medicare Other | Admitting: Surgery

## 2011-04-04 ENCOUNTER — Telehealth: Payer: Self-pay | Admitting: Oncology

## 2011-04-04 NOTE — Telephone Encounter (Signed)
Lvm advising appt 04/18/11 @ 1pm.

## 2011-04-09 ENCOUNTER — Encounter (INDEPENDENT_AMBULATORY_CARE_PROVIDER_SITE_OTHER): Payer: Self-pay | Admitting: Surgery

## 2011-04-09 ENCOUNTER — Ambulatory Visit (INDEPENDENT_AMBULATORY_CARE_PROVIDER_SITE_OTHER): Payer: Medicare Other | Admitting: Surgery

## 2011-04-09 VITALS — BP 108/62 | HR 66 | Temp 97.2°F | Resp 16 | Ht 65.0 in | Wt 131.6 lb

## 2011-04-09 DIAGNOSIS — Z853 Personal history of malignant neoplasm of breast: Secondary | ICD-10-CM

## 2011-04-09 NOTE — Progress Notes (Signed)
NAME: [name]       DOB: [birth year]           DATE: 04/09/2011       MRN: [id number]   [name] is a 71 y.o.Marland Kitchenfemale who presents for routine followup of her Stage IIA right upper outer quadrant breast cancer diagnosed in 2012 and treated with Lumpectomy and radiation. She has no problems or concerns on either side.She recently completed radiaion  PFSH: She has had no significant changes since the last visit here.  ROS: There have been no significant changes since the last visit here  EXAM: General: The patient is alert, oriented, generally healty appearing, NAD. Mood and affect are normal.  Breasts:  Right breast shows mild radiation change with increased pigmentation and tenderness, especially at the nipple. No suggestion of recurrence. Lumpectomy site firm, not hard. Left breast norma  Lymphatics: She has no axillary or supraclavicular adenopathy on either side.  Extremities: Full ROM of the surgical side with no lymphedema noted.  Data Reviewed: Notes from radiation therapy  Impression: Doing well, with no evidence of recurrent cancer or new cancer  Plan: Will continue to follow up on an annual basis here, next visit in September for a one year from surgery follow up

## 2011-04-16 ENCOUNTER — Ambulatory Visit: Payer: Medicare Other | Admitting: Radiation Oncology

## 2011-04-17 ENCOUNTER — Ambulatory Visit
Admission: RE | Admit: 2011-04-17 | Discharge: 2011-04-17 | Disposition: A | Discharge status: Home / Self Care | Payer: Medicare Other | Source: Ambulatory Visit | Attending: Radiation Oncology | Admitting: Radiation Oncology

## 2011-04-17 ENCOUNTER — Ambulatory Visit: Payer: Medicare Other | Admitting: Radiation Oncology

## 2011-04-17 ENCOUNTER — Encounter: Payer: Self-pay | Admitting: Radiation Oncology

## 2011-04-17 VITALS — BP 94/62 | HR 57 | Resp 18 | Wt 135.9 lb

## 2011-04-17 DIAGNOSIS — C50919 Malignant neoplasm of unspecified site of unspecified female breast: Secondary | ICD-10-CM | POA: Insufficient documentation

## 2011-04-17 DIAGNOSIS — C50219 Malignant neoplasm of upper-inner quadrant of unspecified female breast: Secondary | ICD-10-CM

## 2011-04-17 MED ORDER — RADIAPLEXRX EX GEL
Freq: Once | CUTANEOUS | Status: AC
  Administered 2011-04-17: 09:00:00 via TOPICAL

## 2011-04-17 NOTE — Progress Notes (Signed)
Patient presents to the clinic today unaccompanied for a follow up appointment with Dr. Dayton Scrape. Patient is alert and oriented to person, place, and time. No distress noted. Steady gait noted. Pleasant affect noted. Patient reports intermittent episodes of dizziness. Patient reports her energy level is beginning to improve. Patient denies nausea, vomiting, diarrhea or constipation. Patient denies pain at this time. Patient reports her nipple is very tender. Patient requested another tube of Radiaplex. Will provided patient with this. Patient has a follow up appointment with Dr. Darnelle Catalan at 1300 on Thursday. Reported all findings to Dr. Dayton Scrape.

## 2011-04-17 NOTE — Progress Notes (Signed)
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 She is doing well with the exception of right nipple discomfort. She tells me that she underwent genetic testing at [facility], and she is awaiting the results. She has not yet scheduled for her post treatment mammography. She'll see Dr. Jamey Ripa for a followup visit in 6-9 months. She'll see Dr. Darnelle Catalan later this week.  Physical examination: Head and neck examination: Grossly unremarkable. Nodes: Without palpable cervical, supraclavicular, or axillary lymphadenopathy. Chest: Lungs clear. Breasts: There is residual hyperpigmentation of the skin along the right breast. There is no longer any significant degree of central right breast erythema that was seen prior to her radiation therapy. There is no obvious infection. There is minimal thickening. No dominant masses are appreciated. There is hyperkeratosis along the central nipple. The nipple is tender to palpation. There is no discharge. Left breast without masses or lesions.  Impression: Satisfactory progress. I wonder if her nipple ducts are plugged as a result of her radiation therapy.  Plan: I suggested to her that she apply warm compresses 3 times a day to her right nipple. She may wait until later this year to have a baseline right breast mammogram, and this can be scheduled through Dr. Darnelle Catalan or Dr. Jamey Ripa. She awaits the results of her genetic testing from [facility].

## 2011-04-18 ENCOUNTER — Ambulatory Visit (HOSPITAL_BASED_OUTPATIENT_CLINIC_OR_DEPARTMENT_OTHER): Payer: Medicare Other | Admitting: Oncology

## 2011-04-18 ENCOUNTER — Telehealth: Payer: Self-pay | Admitting: Oncology

## 2011-04-18 ENCOUNTER — Other Ambulatory Visit (HOSPITAL_BASED_OUTPATIENT_CLINIC_OR_DEPARTMENT_OTHER): Payer: Medicare Other | Admitting: Lab

## 2011-04-18 VITALS — BP 84/51 | HR 73 | Temp 97.3°F | Ht 65.0 in | Wt 135.1 lb

## 2011-04-18 DIAGNOSIS — C50219 Malignant neoplasm of upper-inner quadrant of unspecified female breast: Secondary | ICD-10-CM

## 2011-04-18 DIAGNOSIS — C50419 Malignant neoplasm of upper-outer quadrant of unspecified female breast: Secondary | ICD-10-CM | POA: Diagnosis not present

## 2011-04-18 LAB — CBC WITH DIFFERENTIAL/PLATELET
Basophils Absolute: 0 10*3/uL (ref 0.0–0.1)
EOS%: 1.7 % (ref 0.0–7.0)
HGB: 13.6 g/dL (ref 11.6–15.9)
MCH: 31.8 pg (ref 25.1–34.0)
MCHC: 34.5 g/dL (ref 31.5–36.0)
MCV: 92.3 fL (ref 79.5–101.0)
MONO%: 6.8 % (ref 0.0–14.0)
NEUT%: 63.9 % (ref 38.4–76.8)
RDW: 13.2 % (ref 11.2–14.5)

## 2011-04-18 LAB — COMPREHENSIVE METABOLIC PANEL
AST: 17 U/L (ref 0–37)
Alkaline Phosphatase: 57 U/L (ref 39–117)
BUN: 17 mg/dL (ref 6–23)
Creatinine, Ser: 0.98 mg/dL (ref 0.50–1.10)
Potassium: 4.7 mEq/L (ref 3.5–5.3)

## 2011-04-18 NOTE — Telephone Encounter (Signed)
gve the pt her April 2013 appt calendar 

## 2011-04-18 NOTE — Progress Notes (Signed)
IDShifra Swartzentruber Aul  DOB: 1940-10-10  MR#: [id number]  CSN#: [id number]  HPI: [name] has neglected her health maintenance for the last several years, partly because she has been so concerned about her sister who died from pancreatic cancer this past 2022/07/12.  At any rate, recently she was putting on her bra and noticed a change in the lateral aspect of her right breast.  She brought it to Dr. Benjaman Pott attention, and she was set up for bilateral diagnostic mammography at the breast center November 21, 2010.  This found her breasts to be heterogeneously dense, and in the upper outer quadrant there was an irregular mass measuring approximately 2.7 cm.  On physical exam, Dr. Renae Gloss was able to palpate a soft mass measuring approximately 2 cm which on ultrasound had irregular borders and was hypoechoic.  It measured 1.7 cm sonographically.  Evaluation of the right axilla was negative.  On the same day, the patient proceeded to ultrasound-guided core biopsy, and the pathology from this procedure ([id number]) showed an invasive mammary carcinoma with histiocytoid features, triple negative, with a proliferation marker by MIB-1 of 8%.  [name] case was discussed at the multidisciplinary breast cancer conference on September 26th.  The cells are certainly unusual looking with a significant amount of cytoplasmic vacuolation.  The fact that she has one of the so called "low-grade triple negative tumors" led to much discussion, and the feeling was that chemotherapy was not automatic in this case but certainly needed to be considered, and that is the reason the patient is here today.  Interval History:   The patient returns today after completing her radiation treatments mid December 2012. Aside from skin changes, she felt quite fatigued. She is only just beginning to recover. She still having some discomfort in the right nipple, which she is soaking once a day. Otherwise interval history was generally benign, and she enjoyed  the holidays with her family.  ROS:  She is having no unusual headaches, visual changes, cough, and production, shortness of breath, pleurisy, change in bowel or bladder habits, rash, bleeding, fever, or pain. Detailed review of systems was noncontributory.  Allergies  Allergen Reactions  . Penicillins Other (See Comments)    Patient does not know of reaction. Was based on a scratch test.    Current Outpatient Prescriptions  Medication Sig Dispense Refill  . pravastatin (PRAVACHOL) 40 MG tablet Take 40 mg by mouth daily.        . naproxen sodium (ANAPROX) 220 MG tablet Take 220 mg by mouth 1 day or 1 dose.         PAST MEDICAL HISTORY:  Significant for: 1. Hypercholesterolemia. 2. Osteoporosis. 3. History of a left cheek basal cell cancer. 4. History of 20-pack-year tobacco abuse, discontinued in 1980. 5. 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 6. History of tonsillectomy and adenoidectomy. 7. History of early cataracts. 8. History of prior a left breast biopsy for a benign tumor removed at age 70. 3. History of mild urinary incontinence.  FAMILY HISTORY:  The patient's father died from complications of alcoholism at the age of 52.  The patient's mother died at the age of 70 with stomach cancer.  The patient's sister died this 07-12-22 at the age of 2 from pancreatic cancer after a very difficult 18 months.  The patient has 1 brother who is 59 years old and in fair health.  There is no history of breast cancer in the family.  There is  no history of ovarian cancer in the family to her knowledge.  GYNECOLOGIC HISTORY:  Menarche at age 38, last period in 10.  She is GX, P4, first live birth at age 29.  She never used hormones or birth control pills.  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  She is now retired and lives by herself with some cats, her 2 dogs having died at very advanced ages  recently.  Her daughter, [name], 37, lives in [place], [place].  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  Daughter, [name], 43, lives in [place], and she has a Education administrator in social work working primarily with prisoners.  Son [name], 38, lives in [place].  He is a Naval architect.  Son [name], [place], also lives in [place] and works at [employer] in Office manager.  The patient attends [employer]. [employer].  She has 6 grandchildren ranging in ages from 97 to 57 years old.  HEALTH MAINTENANCE:  She smoked a pack a day for about 20 years but definitively quit in 1980.  She has not had an alcoholic drink since 1985.  She has had 2 colonoscopies, I believe, under Dr. Leary Roca, the most recent one in 2011.  She had a bone density earlier this year, she tells me showing osteoporosis (I do not have those records).  She had her most recent Pap smear 3 or 4 years ago.  Her cholesterol is under treatment.  Objective:  Filed Vitals:   04/18/11 1319  BP: 84/51  Pulse: 73  Temp: 97.3 F (36.3 C)    BMI: Body mass index is 22.48 kg/(m^2).   ECOG FS: 0  Physical Exam:   Sclerae unicteric  Oropharynx clear  No peripheral adenopathy  Lungs clear -- no rales or rhonchi  Heart regular rate and rhythm  Abdomen benign  MSK no focal spinal tenderness, no peripheral edema  Neuro nonfocal  Breast exam: Right breast status post lumpectomy and radiation. The erythema has resolved and there is minimal hyperpigmentation. The nipple is not retracted. There is no obvious nipple change. Left breast is unremarkable.  Lab Results:      Chemistry      Component Value Date/Time   NA 141 01/01/2011 1558   K 4.0 01/01/2011 1558   CL 105 01/01/2011 1558   CO2 28 01/01/2011 1558   BUN 14 01/01/2011 1558   CREATININE 0.87 01/01/2011 1558      Component Value Date/Time   CALCIUM 9.7 01/01/2011 1558   ALKPHOS 66 01/01/2011 1558   AST 18 01/01/2011 1558   ALT 22  01/01/2011 1558   BILITOT 1.1 01/01/2011 1558       Lab Results  Component Value Date   WBC 4.2 04/18/2011   HGB 13.6 04/18/2011   HCT 39.5 04/18/2011   MCV 92.3 04/18/2011   PLT 166 04/18/2011   NEUTROABS 2.7 04/18/2011    Studies/Results:  No new results found.  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 She will see [place] every 3 months for the first 2 years. Of in October of 2014  we will repeat a chest CT and assuming that remains negative we will start q. 6 month followup for the remaining 3 years.  She has been tested for the BRCA1 and 2 genes, and those results were negative, but additional testing is pending through [facility]. I don't expect we will have those results until the summer.  I have recommended that she start exercising, mostly walking, 45 minutes 5 times a week, and that she eat mostly vegetables, with a minority of starch in the. We will discuss these suggestions again at the next visit. T  Summit Borchardt C 04/18/2011

## 2011-07-10 ENCOUNTER — Other Ambulatory Visit (HOSPITAL_BASED_OUTPATIENT_CLINIC_OR_DEPARTMENT_OTHER): Payer: Medicare Other | Admitting: Lab

## 2011-07-10 ENCOUNTER — Encounter: Payer: Self-pay | Admitting: Physician Assistant

## 2011-07-10 ENCOUNTER — Ambulatory Visit (HOSPITAL_BASED_OUTPATIENT_CLINIC_OR_DEPARTMENT_OTHER): Payer: Medicare Other | Admitting: Physician Assistant

## 2011-07-10 VITALS — BP 91/58 | HR 57 | Temp 98.2°F | Ht 65.0 in | Wt 134.5 lb

## 2011-07-10 DIAGNOSIS — C50219 Malignant neoplasm of upper-inner quadrant of unspecified female breast: Secondary | ICD-10-CM

## 2011-07-10 DIAGNOSIS — Z853 Personal history of malignant neoplasm of breast: Secondary | ICD-10-CM

## 2011-07-10 LAB — CBC WITH DIFFERENTIAL/PLATELET
Basophils Absolute: 0 10*3/uL (ref 0.0–0.1)
EOS%: 1.7 % (ref 0.0–7.0)
HCT: 37.8 % (ref 34.8–46.6)
HGB: 12.8 g/dL (ref 11.6–15.9)
MCH: 31.6 pg (ref 25.1–34.0)
MCV: 93.4 fL (ref 79.5–101.0)
MONO%: 7.2 % (ref 0.0–14.0)
NEUT%: 66.9 % (ref 38.4–76.8)
Platelets: 146 10*3/uL (ref 145–400)

## 2011-07-10 NOTE — Progress Notes (Signed)
IDMariafernanda Hendricksen [name]   DOB: [birth year]  MR#: [id number]  NFA#:[id number]  HISTORY OF PRESENT ILLNESS: [name] had neglected her health maintenance for the last several years, partly because she has been so concerned about her sister who died from pancreatic cancer this past March. At any rate, recently she was putting on her bra and noticed a change in the lateral aspect of her right breast. She brought it to Dr. Benjaman Pott attention, and she was set up for bilateral diagnostic mammography at the breast center November 21, 2010. This found her breasts to be heterogeneously dense, and in the upper outer quadrant there was an irregular mass measuring approximately 2.7 cm.   On physical exam, Dr. Renae Gloss was able to palpate a soft mass measuring approximately 2 cm which on ultrasound had irregular borders and was hypoechoic. It measured 1.7 cm sonographically. Evaluation of the right axilla was negative.   On the same day, the patient proceeded to ultrasound-guided core biopsy, and the pathology from this procedure ([id number]) showed an invasive mammary carcinoma with histiocytoid features, triple negative, with a proliferation marker by MIB-1 of 8%.   [name] case was discussed at the multidisciplinary breast cancer conference on September 26th. The cells were certainly unusual looking with a significant amount of cytoplasmic vacuolation. The fact that she has one of the so called "low-grade triple negative tumors" led to much discussion, and the feeling was that chemotherapy was not automatic in this case but certainly needed to be considered.    The patient underwent right lumpectomy and sentinel lymph node sampling on 01/03/2011 for a T2N0, stage II breast carcinoma.  This was followed by radiation therapy, completed mid December of 2012. Patient is now being followed by observation alone.  INTERVAL HISTORY: [name] returns today for routine three-month followup of her right breast carcinoma. Interval  history is remarkable for her having begun an exercise program, exercising at least 45 minutes 5 times weekly. She has really improved her diet, eating primarily fruits and vegetables, organic meat, and staying away from the. She's eating very few starches. Overall she is feeling very well, and feels that she is 80-90% improved since her original surgery.  REVIEW OF SYSTEMS: The patient's energy level is good. She's had no recent illnesses and denies fevers, chills, or night sweats. No nausea or change in bowel habits. No chest pain or shortness of breath. No abnormal headaches, dizziness, change in vision. No myalgias, arthralgias or bony pain, no peripheral swelling.  A detailed review of systems is otherwise noncontributory.   PAST MEDICAL HISTORY: Past Medical History  Diagnosis Date  . Hyperlipidemia   . Fatigue   . Weight decrease   . Blurred vision   . Gum disease   . Irregular heart beat   . Poor appetite   . Bruises easily   . Breast lump   . Forgetfulness   . Weakness   . Depression     mild  . Breast cancer, IDC, Right, UIQ, Stage II, triple negative 11/21/2010    PAST SURGICAL HISTORY: Past Surgical History  Procedure Date  . Breast surgery 1962    left breast tumor   . Breast lumpectomy w/ needle localization 01/03/2011    Right, w [facility] Dr Jamey Ripa  . Breast surgery 01/03/2011    Rt Breast cancer    FAMILY HISTORY Family History  Problem Relation Age of Onset  . Cancer Mother     stomach  . Cancer Maternal Uncle  colon  . Cancer Sister     pancreatic  There is no history of breast cancer in the family. There is no history of ovarian cancer in the family to her knowledge.   GYNECOLOGIC HISTORY: Menarche at age 63, last period in 17. She is GX, P4, first live birth at age 82. She never used hormones or birth control pills.   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 Her daughter, [name], 30, lives in [place], [place]. 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 Daughter, [name], 43, lives in [place], and she has a Education administrator in social work working primarily with prisoners. Son [name], 38, lives in [place]. He is a Naval architect. Son [name], [place], also lives in [place] and works at [employer] in Office manager. The patient attends [employer]. [employer]. She has 6 grandchildren ranging in ages from 76 to 57 years old.     ADVANCED DIRECTIVES:  HEALTH MAINTENANCE: History  Substance Use Topics  . Smoking status: Former Games developer  . Smokeless tobacco: Never Used   Comment: quit 1980  . Alcohol Use: No     Colonoscopy: 2011, [facility]  PAP: approx 2010  Bone density: 2013, osteoporosis  Lipid panel:   Allergies  Allergen Reactions  . Penicillins Other (See Comments)    Patient does not know of reaction. Was based on a scratch test.    Current Outpatient Prescriptions  Medication Sig Dispense Refill  . calcium carbonate 200 MG capsule Take 250 mg by mouth 2 (two) times daily with a meal.      . cholecalciferol (VITAMIN D) 400 UNITS TABS Take 1,000 Units by mouth daily.      . pravastatin (PRAVACHOL) 40 MG tablet Take 40 mg by mouth daily.          OBJECTIVE: Filed Vitals:   07/10/11 1456  BP: 91/58  Pulse: 57  Temp: 98.2 F (36.8 C)     Body mass index is 22.38 kg/(m^2).    ECOG FS: 0  Physical Exam: HEENT:  Sclerae anicteric, conjunctivae pink.  Oropharynx clear.  No mucositis or candidiasis.   Nodes:  No cervical, supraclavicular, or axillary lymphadenopathy palpated.  Breast Exam:  Right breast is status post lumpectomy with well-healed incision. No suspicious nodularity or skin changes. No evidence of local recurrence. Left breast is benign with no masses, skin changes, or nipple inversion.  Lungs:   Clear to auscultation bilaterally.  No crackles, rhonchi, or wheezes.   Heart:  Regular rate and rhythm.  No gallops, murmurs, or rubs. Abdomen:  Soft, nontender.  Positive bowel sounds.  No organomegaly or masses palpated.   Musculoskeletal:  No focal spinal tenderness to palpation.  Extremities:  Benign.  No peripheral edema or cyanosis.   Skin:  Benign.   Neuro:  Nonfocal. Alert and oriented x3.    LAB RESULTS: Lab Results  Component Value Date   WBC 4.5 07/10/2011   NEUTROABS 3.0 07/10/2011   HGB 12.8 07/10/2011   HCT 37.8 07/10/2011   MCV 93.4 07/10/2011   PLT 146 07/10/2011      Chemistry      Component Value Date/Time   NA 142 04/18/2011 1307   K 4.7 04/18/2011 1307   CL 107 04/18/2011 1307  CO2 27 04/18/2011 1307   BUN 17 04/18/2011 1307   CREATININE 0.98 04/18/2011 1307      Component Value Date/Time   CALCIUM 9.7 04/18/2011 1307   ALKPHOS 57 04/18/2011 1307   AST 17 04/18/2011 1307   ALT 22 04/18/2011 1307   BILITOT 0.9 04/18/2011 1307       Lab Results  Component Value Date   LABCA2 23 01/01/2011   CMET and CA27.29 were both repeated today, results pending.  STUDIES: No results found.  ASSESSMENT: 71 year old [place] woman   (1)  status post right lumpectomy and sentinel lymph node sampling 01/03/2011 for a T2 N0, stage II breast cancer with histiocytoid features, triple negative, grade 2, with an MIB-1 of 8%,   (2)  status post radiation completed mid December 2012  (3)  BRCA1 and BRCA2 negative  PLAN: With regards to her breast cancer, Madasyn appears to be doing well, and there is no clinical evidence of disease recurrence at this time. We will continue to follow her every 3 months with labs and physical exam for the first 2 years. In October 2014 we will repeat a chest CT, and assuming that is negative, we'll initiate followup every 6 months for the remaining 3 years.  All this was reviewed in detail with the patient who voices understanding and agreement  with our plan. She will call any changes or problems.  Johnsie Moscoso    07/10/2011

## 2011-07-11 ENCOUNTER — Telehealth: Payer: Self-pay | Admitting: *Deleted

## 2011-07-11 LAB — CANCER ANTIGEN 27.29: CA 27.29: 20 U/mL (ref 0–39)

## 2011-07-11 LAB — COMPREHENSIVE METABOLIC PANEL
Albumin: 3.9 g/dL (ref 3.5–5.2)
Alkaline Phosphatase: 60 U/L (ref 39–117)
BUN: 15 mg/dL (ref 6–23)
Glucose, Bld: 124 mg/dL — ABNORMAL HIGH (ref 70–99)
Potassium: 4 mEq/L (ref 3.5–5.3)
Total Bilirubin: 1.1 mg/dL (ref 0.3–1.2)

## 2011-07-11 NOTE — Telephone Encounter (Signed)
left voice message informing the patient of the new date and time on 09-2011 and 12-2011 mammogram at breast center on 11-25-2011 9:00am

## 2011-07-29 ENCOUNTER — Telehealth: Payer: Self-pay | Admitting: Genetic Counselor

## 2011-09-10 ENCOUNTER — Encounter: Payer: Self-pay | Admitting: Oncology

## 2011-10-08 ENCOUNTER — Other Ambulatory Visit (HOSPITAL_BASED_OUTPATIENT_CLINIC_OR_DEPARTMENT_OTHER): Payer: Medicare Other | Admitting: Lab

## 2011-10-08 DIAGNOSIS — C50419 Malignant neoplasm of upper-outer quadrant of unspecified female breast: Secondary | ICD-10-CM | POA: Diagnosis not present

## 2011-10-08 DIAGNOSIS — C50219 Malignant neoplasm of upper-inner quadrant of unspecified female breast: Secondary | ICD-10-CM

## 2011-10-08 LAB — CBC WITH DIFFERENTIAL/PLATELET
Basophils Absolute: 0 10*3/uL (ref 0.0–0.1)
Eosinophils Absolute: 0.1 10*3/uL (ref 0.0–0.5)
HGB: 13.4 g/dL (ref 11.6–15.9)
MCV: 91.8 fL (ref 79.5–101.0)
MONO#: 0.3 10*3/uL (ref 0.1–0.9)
NEUT#: 2.7 10*3/uL (ref 1.5–6.5)
RBC: 4.34 10*6/uL (ref 3.70–5.45)
RDW: 13.1 % (ref 11.2–14.5)
WBC: 4.4 10*3/uL (ref 3.9–10.3)

## 2011-10-08 LAB — COMPREHENSIVE METABOLIC PANEL
Albumin: 4.3 g/dL (ref 3.5–5.2)
Alkaline Phosphatase: 59 U/L (ref 39–117)
BUN: 20 mg/dL (ref 6–23)
CO2: 28 mEq/L (ref 19–32)
Calcium: 10.1 mg/dL (ref 8.4–10.5)
Chloride: 107 mEq/L (ref 96–112)
Glucose, Bld: 78 mg/dL (ref 70–99)
Potassium: 4.3 mEq/L (ref 3.5–5.3)
Sodium: 140 mEq/L (ref 135–145)
Total Protein: 6.6 g/dL (ref 6.0–8.3)

## 2011-10-15 ENCOUNTER — Ambulatory Visit (HOSPITAL_BASED_OUTPATIENT_CLINIC_OR_DEPARTMENT_OTHER): Payer: Medicare Other | Admitting: Oncology

## 2011-10-15 VITALS — BP 98/59 | HR 65 | Temp 97.2°F | Ht 65.0 in | Wt 133.8 lb

## 2011-10-15 DIAGNOSIS — C50219 Malignant neoplasm of upper-inner quadrant of unspecified female breast: Secondary | ICD-10-CM

## 2011-10-15 NOTE — Progress Notes (Signed)
IDTim Wilhide [name]   DOB: 12-25-1940  MR#: [id number]  WUJ#:[id number]  HISTORY OF PRESENT ILLNESS: [name] had neglected her health maintenance for the last several years, partly because she has been so concerned about her sister who died from pancreatic cancer this past March. At any rate, recently she was putting on her bra and noticed a change in the lateral aspect of her right breast. She brought it to Dr. Benjaman Pott attention, and she was set up for bilateral diagnostic mammography at the breast center November 21, 2010. This found her breasts to be heterogeneously dense, and in the upper outer quadrant there was an irregular mass measuring approximately 2.7 cm.   On physical exam, Dr. Renae Gloss was able to palpate a soft mass measuring approximately 2 cm which on ultrasound had irregular borders and was hypoechoic. It measured 1.7 cm sonographically. Evaluation of the right axilla was negative.   On the same day, the patient proceeded to ultrasound-guided core biopsy, and the pathology from this procedure ([id number]) showed an invasive mammary carcinoma with histiocytoid features, triple negative, with a proliferation marker by MIB-1 of 8%.   [name] case was discussed at the multidisciplinary breast cancer conference on September 26th. The cells were certainly unusual looking with a significant amount of cytoplasmic vacuolation. The fact that she has one of the so called "low-grade triple negative tumors" led to much discussion, and the feeling was that chemotherapy was not automatic in this case but certainly needed to be considered.    The patient underwent right lumpectomy and sentinel lymph node sampling on 01/03/2011 for a T2N0, stage II breast carcinoma.  She decided to forgo chemotherapy. She received radiation therapy, completed mid December of 2012. Her subsequent history is as detailed below.  INTERVAL HISTORY: [name] returns today for routine three-month followup of her right breast  carcinoma. The interval history is generally unremarkable. She has body a treadmill which she keeps in her bedroom in front of the TV, and uses it almost daily. She is eating lots of vegetables and fruits, all her Marylu Lund. 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 and once a month she treats herself with an [place] meal.  REVIEW OF SYSTEMS: She is delighted with the cosmetic result of her surgery. She has absolutely no symptoms to report. A detailed review of systems today was entirely unremarkable.   PAST MEDICAL HISTORY: Past Medical History  Diagnosis Date  . Hyperlipidemia   . Fatigue   . Weight decrease   . Blurred vision   . Gum disease   . Irregular heart beat   . Poor appetite   . Bruises easily   . Breast lump   . Forgetfulness   . Weakness   . Depression     mild  . Breast cancer, IDC, Right, UIQ, Stage II, triple negative 11/21/2010    PAST SURGICAL HISTORY: Past Surgical History  Procedure Date  . Breast surgery 1962    left breast tumor   . Breast lumpectomy w/ needle localization 01/03/2011    Right, w [facility] Dr Jamey Ripa  . Breast surgery 01/03/2011    Rt Breast cancer    FAMILY HISTORY Family History  Problem Relation Age of Onset  . Cancer Mother     stomach  . Cancer Maternal Uncle     colon  . Cancer Sister     pancreatic  There is no history of breast cancer in the family. There is no history of ovarian cancer in the family to her  knowledge.   GYNECOLOGIC HISTORY: Menarche at age 6, last period in 40. She is GX, P4, first live birth at age 43. She never used hormones or birth control pills.   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 Her daughter, Corliss Skains, lives in [place], [place]. 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 Daughter, Coti Burd, lives in  [place], and she has a Education administrator in social work working primarily with prisoners. Son Andres Ege Dagostino,  lives in [place]. He is a Naval architect. Son Lindaann Gradilla, also lives in [place] and works at [employer] in Office manager. The patient attends [place]. [employer]. She has 6 grandchildren.      ADVANCED DIRECTIVES:  HEALTH MAINTENANCE: History  Substance Use Topics  . Smoking status: Former Games developer  . Smokeless tobacco: Never Used   Comment: quit 1980  . Alcohol Use: No     Colonoscopy: 2011, [facility]  PAP: approx 2010  Bone density: 2013, osteoporosis  Lipid panel:   Allergies  Allergen Reactions  . Penicillins Other (See Comments)    Patient does not know of reaction. Was based on a scratch test.    Current Outpatient Prescriptions  Medication Sig Dispense Refill  . calcium carbonate 200 MG capsule Take 250 mg by mouth 2 (two) times daily with a meal.      . cholecalciferol (VITAMIN D) 400 UNITS TABS Take 1,000 Units by mouth daily.      . pravastatin (PRAVACHOL) 40 MG tablet Take 40 mg by mouth daily.          OBJECTIVE: Middle-aged white woman who appears well Filed Vitals:   10/15/11 1126  BP: 98/59  Pulse: 65  Temp: 97.2 F (36.2 C)     Body mass index is 22.27 kg/(m^2).    ECOG FS: 0  Physical Exam: HEENT:  Sclerae anicteric. Oropharynx clear.   Nodes:  No cervical, supraclavicular, or axillary lymphadenopathy palpated.  Breast Exam:  Right breast is status post lumpectomy with well-healed incision. No evidence of local recurrence. Left breast is benign with no masses, skin changes, or nipple inversion.  Lungs:  Clear to auscultation bilaterally.  No crackles, rhonchi, or wheezes.   Heart:  Regular rate and rhythm.  No murmur appreciated Abdomen:  Soft, nontender.  Positive bowel sounds.  No organomegaly or masses palpated.   Musculoskeletal:  No focal spinal tenderness to palpation.  Extremities: No peripheral edema or cyanosis.   Neuro:  Nonfocal.  Alert and oriented x3.    LAB RESULTS: Lab Results  Component Value Date   WBC 4.4 10/08/2011   NEUTROABS 2.7 10/08/2011   HGB 13.4 10/08/2011   HCT 39.8 10/08/2011   MCV 91.8 10/08/2011   PLT 159 10/08/2011      Chemistry      Component Value Date/Time   NA 140 10/08/2011 1132   K 4.3 10/08/2011 1132   CL 107 10/08/2011 1132   CO2 28 10/08/2011 1132   BUN 20 10/08/2011 1132   CREATININE 0.92 10/08/2011 1132      Component Value Date/Time   CALCIUM 10.1 10/08/2011 1132   ALKPHOS 59 10/08/2011 1132   AST 20 10/08/2011 1132   ALT 18 10/08/2011 1132   BILITOT 1.2 10/08/2011 1132       Lab Results  Component Value Date   LABCA2 16 10/08/2011   CMET and CA27.29 were both repeated today, results pending.  STUDIES: No results found.  ASSESSMENT: 72 y.o.  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 She is going to have mammography in the next month or so and then see [place] again in a daughter. That will be her one-year anniversary. If all continues well at that time she will start seeing [place] on an every 4 month basis. We will move that to every 6 months at the second anniversary. She knows to call for any problems that may develop before the next visit. MAGRINAT,GUSTAV C    10/15/2011

## 2011-11-25 ENCOUNTER — Ambulatory Visit
Admission: RE | Admit: 2011-11-25 | Discharge: 2011-11-25 | Disposition: A | Discharge status: Home / Self Care | Payer: Medicare Other | Source: Ambulatory Visit | Attending: Physician Assistant | Admitting: Physician Assistant

## 2011-11-25 DIAGNOSIS — Z853 Personal history of malignant neoplasm of breast: Secondary | ICD-10-CM | POA: Diagnosis not present

## 2011-11-26 DIAGNOSIS — R55 Syncope and collapse: Secondary | ICD-10-CM | POA: Diagnosis not present

## 2011-11-26 DIAGNOSIS — R5383 Other fatigue: Secondary | ICD-10-CM | POA: Diagnosis not present

## 2011-11-26 DIAGNOSIS — R5381 Other malaise: Secondary | ICD-10-CM | POA: Diagnosis not present

## 2011-11-26 DIAGNOSIS — M899 Disorder of bone, unspecified: Secondary | ICD-10-CM | POA: Diagnosis not present

## 2011-11-26 DIAGNOSIS — E782 Mixed hyperlipidemia: Secondary | ICD-10-CM | POA: Diagnosis not present

## 2011-12-16 DIAGNOSIS — Z23 Encounter for immunization: Secondary | ICD-10-CM | POA: Diagnosis not present

## 2011-12-19 DIAGNOSIS — M899 Disorder of bone, unspecified: Secondary | ICD-10-CM | POA: Diagnosis not present

## 2011-12-19 DIAGNOSIS — M949 Disorder of cartilage, unspecified: Secondary | ICD-10-CM | POA: Diagnosis not present

## 2011-12-25 DIAGNOSIS — R55 Syncope and collapse: Secondary | ICD-10-CM | POA: Diagnosis not present

## 2011-12-31 ENCOUNTER — Other Ambulatory Visit (HOSPITAL_BASED_OUTPATIENT_CLINIC_OR_DEPARTMENT_OTHER): Payer: Medicare Other | Admitting: Lab

## 2011-12-31 DIAGNOSIS — C50219 Malignant neoplasm of upper-inner quadrant of unspecified female breast: Secondary | ICD-10-CM | POA: Diagnosis not present

## 2011-12-31 LAB — CBC WITH DIFFERENTIAL/PLATELET
Eosinophils Absolute: 0.1 10*3/uL (ref 0.0–0.5)
HGB: 13.4 g/dL (ref 11.6–15.9)
MCV: 92.5 fL (ref 79.5–101.0)
MONO%: 6 % (ref 0.0–14.0)
NEUT#: 2.4 10*3/uL (ref 1.5–6.5)
RBC: 4.23 10*6/uL (ref 3.70–5.45)
RDW: 13 % (ref 11.2–14.5)
WBC: 3.8 10*3/uL — ABNORMAL LOW (ref 3.9–10.3)
lymph#: 1.1 10*3/uL (ref 0.9–3.3)

## 2011-12-31 LAB — COMPREHENSIVE METABOLIC PANEL (CC13)
ALT: 20 U/L (ref 0–55)
CO2: 25 mEq/L (ref 22–29)
Calcium: 10 mg/dL (ref 8.4–10.4)
Chloride: 110 mEq/L — ABNORMAL HIGH (ref 98–107)
Creatinine: 0.9 mg/dL (ref 0.6–1.1)
Sodium: 143 mEq/L (ref 136–145)
Total Protein: 6.6 g/dL (ref 6.4–8.3)

## 2012-01-01 LAB — CANCER ANTIGEN 27.29: CA 27.29: 25 U/mL (ref 0–39)

## 2012-01-06 ENCOUNTER — Other Ambulatory Visit: Payer: Self-pay | Admitting: Physician Assistant

## 2012-01-06 DIAGNOSIS — C50219 Malignant neoplasm of upper-inner quadrant of unspecified female breast: Secondary | ICD-10-CM

## 2012-01-07 ENCOUNTER — Other Ambulatory Visit (HOSPITAL_BASED_OUTPATIENT_CLINIC_OR_DEPARTMENT_OTHER): Payer: Medicare Other | Admitting: Lab

## 2012-01-07 ENCOUNTER — Other Ambulatory Visit: Payer: Self-pay | Admitting: Physician Assistant

## 2012-01-07 ENCOUNTER — Ambulatory Visit (HOSPITAL_BASED_OUTPATIENT_CLINIC_OR_DEPARTMENT_OTHER): Payer: Medicare Other | Admitting: Physician Assistant

## 2012-01-07 ENCOUNTER — Encounter: Payer: Self-pay | Admitting: Physician Assistant

## 2012-01-07 ENCOUNTER — Telehealth: Payer: Self-pay | Admitting: Oncology

## 2012-01-07 VITALS — BP 111/69 | HR 64 | Temp 97.8°F | Resp 20 | Ht 65.0 in | Wt 131.5 lb

## 2012-01-07 DIAGNOSIS — C50419 Malignant neoplasm of upper-outer quadrant of unspecified female breast: Secondary | ICD-10-CM | POA: Diagnosis not present

## 2012-01-07 DIAGNOSIS — C50219 Malignant neoplasm of upper-inner quadrant of unspecified female breast: Secondary | ICD-10-CM

## 2012-01-07 DIAGNOSIS — R17 Unspecified jaundice: Secondary | ICD-10-CM

## 2012-01-07 LAB — COMPREHENSIVE METABOLIC PANEL (CC13)
ALT: 19 U/L (ref 0–55)
CO2: 22 mEq/L (ref 22–29)
Calcium: 10 mg/dL (ref 8.4–10.4)
Chloride: 109 mEq/L — ABNORMAL HIGH (ref 98–107)
Sodium: 141 mEq/L (ref 136–145)
Total Protein: 7.1 g/dL (ref 6.4–8.3)

## 2012-01-07 LAB — CBC WITH DIFFERENTIAL/PLATELET
BASO%: 0.7 % (ref 0.0–2.0)
MCHC: 34.9 g/dL (ref 31.5–36.0)
MONO#: 0.3 10*3/uL (ref 0.1–0.9)
RBC: 4.16 10*6/uL (ref 3.70–5.45)
WBC: 3.7 10*3/uL — ABNORMAL LOW (ref 3.9–10.3)
lymph#: 1 10*3/uL (ref 0.9–3.3)

## 2012-01-07 NOTE — Progress Notes (Signed)
IDCarissa Musick [name]   DOB: 1940/06/07  MR#: [id number]  WUJ#:[id number]  HISTORY OF PRESENT ILLNESS: [name] had neglected her health maintenance for the last several years, partly because she has been so concerned about her sister who died from pancreatic cancer this past March. At any rate, recently she was putting on her bra and noticed a change in the lateral aspect of her right breast. She brought it to Dr. Benjaman Pott attention, and she was set up for bilateral diagnostic mammography at the breast center November 21, 2010. This found her breasts to be heterogeneously dense, and in the upper outer quadrant there was an irregular mass measuring approximately 2.7 cm.   On physical exam, Dr. Renae Gloss was able to palpate a soft mass measuring approximately 2 cm which on ultrasound had irregular borders and was hypoechoic. It measured 1.7 cm sonographically. Evaluation of the right axilla was negative.   On the same day, the patient proceeded to ultrasound-guided core biopsy, and the pathology from this procedure ([id number]) showed an invasive mammary carcinoma with histiocytoid features, triple negative, with a proliferation marker by MIB-1 of 8%.   [name] case was discussed at the multidisciplinary breast cancer conference on September 26th. The cells were certainly unusual looking with a significant amount of cytoplasmic vacuolation. The fact that she has one of the so called "low-grade triple negative tumors" led to much discussion, and the feeling was that chemotherapy was not automatic in this case but certainly needed to be considered.    The patient underwent right lumpectomy and sentinel lymph node sampling on 01/03/2011 for a T2N0, stage II breast carcinoma.  She decided to forgo chemotherapy. She received radiation therapy, completed mid December of 2012. Her subsequent history is as detailed below.  INTERVAL HISTORY: [name] returns today for routine three-month followup of her right breast  carcinoma. The interval history is generally unremarkable. She enjoyed her summer, and was able to take a trip to [place] in [place] which she enjoyed. She is feeling well, and has few complaints today.   I will mention that her labs were drawn 1 week ago on 12/31/2011, with a total bilirubin elevated at 1.8. This is the first time this has been elevated, and the remainder of the liver enzymes were all normal. She has had no abdominal pain, no nausea, and no history of liver disease or gallbladder disease.  REVIEW OF SYSTEMS: [name] had a recent "virus" with low-grade fevers and some diarrhea. This has now resolved. She denies any additional fevers, chills, or drenching night sweats. She's eating and drinking well with no current nausea. No cough or increased shortness of breath and no chest pain. No abnormal headaches or dizziness. No unusual myalgias, arthralgias, bony pain, or peripheral swelling.  A detailed review of systems is otherwise noncontributory.   PAST MEDICAL HISTORY: Past Medical History  Diagnosis Date  . Hyperlipidemia   . Fatigue   . Weight decrease   . Blurred vision   . Gum disease   . Irregular heart beat   . Poor appetite   . Bruises easily   . Breast lump   . Forgetfulness   . Weakness   . Depression     mild  . Breast cancer, IDC, Right, UIQ, Stage II, triple negative 11/21/2010    PAST SURGICAL HISTORY: Past Surgical History  Procedure Date  . Breast surgery 1962    left breast tumor   . Breast lumpectomy w/ needle localization 01/03/2011    Right,  w [facility] Dr Jamey Ripa  . Breast surgery 01/03/2011    Rt Breast cancer    FAMILY HISTORY Family History  Problem Relation Age of Onset  . Cancer Mother     stomach  . Cancer Maternal Uncle     colon  . Cancer Sister     pancreatic  There is no history of breast cancer in the family. There is no history of ovarian cancer in the family to her knowledge.   GYNECOLOGIC HISTORY: Menarche at age 40, last  period in 61. She is GX, P4, first live birth at age 17. She never used hormones or birth control pills.   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 Her daughter, Corliss Skains, lives in [place], [place]. 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 Daughter, Ramona Ruark, lives in [place], and she has a Education administrator in social work working primarily with prisoners. Son Andres Ege Garate,  lives in [place]. He is a Naval architect. Son Musette Kisamore, also lives in [place] and works at [employer] in Office manager. The patient attends [place]. [employer]. She has 6 grandchildren.      ADVANCED DIRECTIVES:  HEALTH MAINTENANCE: History  Substance Use Topics  . Smoking status: Former Games developer  . Smokeless tobacco: Never Used   Comment: quit 1980  . Alcohol Use: No     Colonoscopy: 2011, [facility]  PAP: approx 2010  Bone density: 2013, osteoporosis  Lipid panel:   Allergies  Allergen Reactions  . Penicillins Other (See Comments)    Patient does not know of reaction. Was based on a scratch test.    Current Outpatient Prescriptions  Medication Sig Dispense Refill  . calcium carbonate (TUMS EX) 750 MG chewable tablet Chew 2 tablets by mouth daily.      . Multiple Vitamin (MULTIVITAMIN) tablet Take 1 tablet by mouth daily.      . pravastatin (PRAVACHOL) 40 MG tablet Take 40 mg by mouth daily.          OBJECTIVE: Middle-aged white woman who appears well Filed Vitals:   01/07/12 1041  BP: 111/69  Pulse: 64  Temp: 97.8 F (36.6 C)  Resp: 20     Body mass index is 21.88 kg/(m^2).    ECOG FS: 0 Filed Weights   01/07/12 1041  Weight: 131 lb 8 oz (59.648 kg)    Physical Exam: HEENT:  Sclerae anicteric. Oropharynx clear.   Nodes:  No cervical, supraclavicular, or axillary lymphadenopathy  palpated.  Breast Exam:  Right breast is status post lumpectomy with well-healed incision. No evidence of local recurrence. Left breast unremarkable. Axillae are benign bilaterally with no adenopathy. Lungs:  Clear to auscultation bilaterally.  No crackles, rhonchi, or wheezes.   Heart:  Regular rate and rhythm.  No murmur appreciated Abdomen:  Soft, nontender.  Positive bowel sounds.  No organomegaly or masses palpated.   Musculoskeletal:  No focal spinal tenderness to palpation.  Extremities: No peripheral edema or cyanosis.   Neuro:  Nonfocal. Alert and oriented x3.    LAB RESULTS: Lab Results  Component Value Date   WBC 3.7* 01/07/2012   NEUTROABS 2.3 01/07/2012   HGB 13.4 01/07/2012   HCT 38.6 01/07/2012   MCV 92.8 01/07/2012   PLT 157 01/07/2012      Chemistry  Component Value Date/Time   NA 143 12/31/2011 1037   NA 140 10/08/2011 1132   K 4.0 12/31/2011 1037   K 4.3 10/08/2011 1132   CL 110* 12/31/2011 1037   CL 107 10/08/2011 1132   CO2 25 12/31/2011 1037   CO2 28 10/08/2011 1132   BUN 11.0 12/31/2011 1037   BUN 20 10/08/2011 1132   CREATININE 0.9 12/31/2011 1037   CREATININE 0.92 10/08/2011 1132      Component Value Date/Time   CALCIUM 10.0 12/31/2011 1037   CALCIUM 10.1 10/08/2011 1132   ALKPHOS 58 12/31/2011 1037   ALKPHOS 59 10/08/2011 1132   AST 18 12/31/2011 1037   AST 20 10/08/2011 1132   ALT 20 12/31/2011 1037   ALT 18 10/08/2011 1132   BILITOT 1.80* 12/31/2011 1037   BILITOT 1.2 10/08/2011 1132     CMET was repeated today, results pending.  Lab Results  Component Value Date   LABCA2 25 12/31/2011    STUDIES: No results found.  ASSESSMENT: 71 y.o.  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 We are rechecking her metabolic panel today, specifically her total bilirubin, and if that is in fact elevated, we will followup accordingly. She is now 1 year out from surgery, and the plan is to continue seeing [name] on a q. 4 month basis. We will move that to every 6 months at the second anniversary. She knows to call for any problems that may develop before the next visit. [name]    01/07/2012

## 2012-01-07 NOTE — Patient Instructions (Signed)
No changes.  Return in 4 months for labs and follow up visit.  Call with any questions or problems   ([phone])

## 2012-01-07 NOTE — Telephone Encounter (Signed)
lmonvm adviisng the pt of her feb 2014 appts °

## 2012-01-08 ENCOUNTER — Other Ambulatory Visit: Payer: Self-pay | Admitting: Physician Assistant

## 2012-01-08 DIAGNOSIS — R17 Unspecified jaundice: Secondary | ICD-10-CM

## 2012-01-08 DIAGNOSIS — C50219 Malignant neoplasm of upper-inner quadrant of unspecified female breast: Secondary | ICD-10-CM

## 2012-01-08 LAB — BILIRUBIN, TOTAL: Total Bilirubin: 1.5 mg/dL — ABNORMAL HIGH (ref 0.3–1.2)

## 2012-01-08 LAB — BILIRUBIN,DIRECT & INDIRECT (FRACTIONATED)
Bilirubin, Direct: 0.3 mg/dL (ref 0.0–0.3)
Indirect Bilirubin: 1.2 mg/dL — ABNORMAL HIGH (ref 0.0–0.9)

## 2012-01-08 NOTE — Progress Notes (Signed)
We repeated [name]'s total bilirubin at her appointment yesterday, 01/07/2012, and also checked a fractionated bilirubin. The indirect bilirubin was slightly elevated at 1.2, with a direct bilirubin normal at 0.3.  I have reviewed these results with Dr. Darnelle Catalan. Recall that all of [name]'s additional labs were within normal limits, including her LFTs and her hemoglobin. 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  [name] did mention when I called her today with these results that her sister was recently diagnosed with hemochromatosis. We will recheck all of her labs when she returns in February, and we'll go ahead and check a ferritin level as a baseline.  [name] Scale, PA-C 01/08/2012

## 2012-01-15 ENCOUNTER — Encounter (INDEPENDENT_AMBULATORY_CARE_PROVIDER_SITE_OTHER): Payer: Self-pay | Admitting: Surgery

## 2012-01-15 ENCOUNTER — Ambulatory Visit (INDEPENDENT_AMBULATORY_CARE_PROVIDER_SITE_OTHER): Payer: Medicare Other | Admitting: Surgery

## 2012-01-15 VITALS — BP 92/48 | HR 60 | Temp 97.6°F | Resp 16 | Ht 65.0 in | Wt 132.2 lb

## 2012-01-15 DIAGNOSIS — Z853 Personal history of malignant neoplasm of breast: Secondary | ICD-10-CM | POA: Diagnosis not present

## 2012-01-15 NOTE — Patient Instructions (Signed)
Continue annual mammograms and follow-up 

## 2012-01-15 NOTE — Progress Notes (Signed)
NAME: [name]       DOB: 08/22/1940           DATE: 01/15/2012       MRN: [id number]   [name] is a 71 y.o.[place] Kitchenfemale who presents for routine followup of her Triple negatvie, stage IIA right breast cancer diagnosed in 2012 and treated with lumpectomy and radiation. She has no problems or concerns on either side.  PFSH: She has had no significant changes since the last visit here.  ROS: There have been no significant changes since the last visit here  EXAM:  VS: BP 92/48  Pulse 60  Temp 97.6 F (36.4 C) (Temporal)  Resp 16  Ht 5\' 5"  (1.651 m)  Wt 132 lb 3.2 oz (59.966 kg)  BMI 22.00 kg/m2  General: The patient is alert, oriented, generally healthy appearing, NAD. Mood and affect are normal.  Breasts:  Tender right breast, lumpectomy site soft, no evidence of recurrence. Left is normal  Lymphatics: She has no axillary or supraclavicular adenopathy on either side.  Extremities: Full ROM of the surgical side with no lymphedema noted.  Data Reviewed: Mammogram OK  Impression: Doing well, with no evidence of recurrent cancer or new cancer  Plan: Will continue to follow up on an annual basis here.She is thinking about prophylactic oophorectomy as she apparently has some increased genetic risk for that

## 2012-01-22 ENCOUNTER — Other Ambulatory Visit: Payer: Self-pay | Admitting: Dermatology

## 2012-01-22 DIAGNOSIS — C44519 Basal cell carcinoma of skin of other part of trunk: Secondary | ICD-10-CM | POA: Diagnosis not present

## 2012-01-30 DIAGNOSIS — Z800 Family history of malignant neoplasm of digestive organs: Secondary | ICD-10-CM | POA: Diagnosis not present

## 2012-01-30 DIAGNOSIS — Z853 Personal history of malignant neoplasm of breast: Secondary | ICD-10-CM | POA: Diagnosis not present

## 2012-01-30 DIAGNOSIS — Z8041 Family history of malignant neoplasm of ovary: Secondary | ICD-10-CM | POA: Diagnosis not present

## 2012-01-30 DIAGNOSIS — C50919 Malignant neoplasm of unspecified site of unspecified female breast: Secondary | ICD-10-CM | POA: Diagnosis not present

## 2012-02-18 DIAGNOSIS — Z853 Personal history of malignant neoplasm of breast: Secondary | ICD-10-CM | POA: Diagnosis not present

## 2012-02-18 DIAGNOSIS — Z8049 Family history of malignant neoplasm of other genital organs: Secondary | ICD-10-CM | POA: Diagnosis not present

## 2012-03-05 DIAGNOSIS — H2510 Age-related nuclear cataract, unspecified eye: Secondary | ICD-10-CM | POA: Diagnosis not present

## 2012-03-05 DIAGNOSIS — H40019 Open angle with borderline findings, low risk, unspecified eye: Secondary | ICD-10-CM | POA: Diagnosis not present

## 2012-03-09 ENCOUNTER — Encounter (HOSPITAL_BASED_OUTPATIENT_CLINIC_OR_DEPARTMENT_OTHER): Payer: Self-pay | Admitting: *Deleted

## 2012-03-09 NOTE — H&P (Signed)
  Patient name  [name], [name] DICTATION#  [id number] CSN# [id number]  [facility], MD 03/09/2012 9:12 AM

## 2012-03-10 ENCOUNTER — Encounter (HOSPITAL_BASED_OUTPATIENT_CLINIC_OR_DEPARTMENT_OTHER): Payer: Self-pay | Admitting: *Deleted

## 2012-03-10 NOTE — Progress Notes (Signed)
NPO AFTER MN. ARRIVES AT 0615. NEEDS EKG. CBC TO BE DONE ON 03-11-2012.

## 2012-03-10 NOTE — H&P (Signed)
NAME:  [name] [name]                     ACCOUNT NO.:  MEDICAL RECORD NO.:  LOCATION:                                 FACILITY:  PHYSICIAN:  Juluis Mire, M.D.        DATE OF BIRTH:  DATE OF ADMISSION: DATE OF DISCHARGE:                             HISTORY & PHYSICAL   Date of her surgery is December 12.  It is going to be done at [facility] outpatient on [facility].  The patient is a 71 year old gravida 7, para 64 female presents for laparoscopically assisted bilateral salpingo-oophorectomy.  The patient has a personal history of breast cancer that was triple negative.  There is a family history of ovarian cancer in her mother.  Her BRCA mutations were negative.  However, the geneticist or oncologist have suggested removal of the ovaries due to her probable increased risk.  Her previous CA-125 and ultrasound were normal.  I discussed alternatives including disclosed evaluation.  She wishes to have the ovaries removed for which she is admitted at the present time.  ALLERGIES:  She is allergic to PENICILLIN.  MEDICATIONS:  She is on pravastatin 40 mg daily.  PAST MEDICAL HISTORY:  The patient has a history of diverticulosis as well as osteoporosis.  As noted, she has a history of breast cancer with previous lumpectomy in October 2012.  She has also had a previous endometrial ablation done in the 80s.  SOCIAL HISTORY:  No tobacco or alcohol use.  FAMILY HISTORY:  Noncontributory.  REVIEW OF SYSTEMS:  Noncontributory.  PHYSICAL EXAMINATION:  VITAL SIGNS: The patient is afebrile, stable vital signs. HEENT:  The patient is normocephalic.  Pupils equal, round, and reactive to light and accommodation.  Extraocular movements were intact.  Sclerae and conjunctivae clear.  Oropharynx clear. NECK:  Without thyromegaly. BREASTS:  Not examined. LUNGS:  Clear. CARDIOVASCULAR SYSTEM:  Regular rhythm and rate.  There are no murmurs or gallops. ABDOMEN:  Benign.  No  mass, organomegaly, or tenderness. PELVIC:  Normal external genitalia.  Vaginal mucosa is clear.  Cervix unremarkable.  Uterus normal size, shape, and contour.  Adnexa free of masses or tenderness. EXTREMITIES:  Trace edema. NEUROLOGIC:  Grossly within normal limits.  IMPRESSION:  Family history of ovarian cancer.  The patient with a personal history of breast cancer.  Negative BRCA mutation testing.  The patient still with increased risk of ovarian cancer.  PLAN:  The patient to undergo laparoscopic-assisted bilateral salpingo- oophorectomy.  The risks of surgery have been discussed including the risk of infection.  Risk of hemorrhage that could require transfusion with the risk of AIDS or hepatitis.  Excessive bleeding could require exploratory surgery and possible hysterectomy.  Risk of injury to adjacent organs including bladder, bowel, ureters that could require further exploratory surgery.  Risk of deep venous thrombosis and pulmonary embolus.  The patient expressed understanding of potential risks, indications, and alternatives.     Juluis Mire, M.D.     JSM/MEDQ  D:  03/09/2012  T:  03/09/2012  Job:  [id number]

## 2012-03-11 DIAGNOSIS — Z79899 Other long term (current) drug therapy: Secondary | ICD-10-CM | POA: Diagnosis not present

## 2012-03-11 DIAGNOSIS — N838 Other noninflammatory disorders of ovary, fallopian tube and broad ligament: Secondary | ICD-10-CM | POA: Diagnosis not present

## 2012-03-11 DIAGNOSIS — M810 Age-related osteoporosis without current pathological fracture: Secondary | ICD-10-CM | POA: Diagnosis not present

## 2012-03-11 DIAGNOSIS — N736 Female pelvic peritoneal adhesions (postinfective): Secondary | ICD-10-CM | POA: Diagnosis not present

## 2012-03-11 DIAGNOSIS — Z853 Personal history of malignant neoplasm of breast: Secondary | ICD-10-CM | POA: Diagnosis not present

## 2012-03-11 DIAGNOSIS — Z8041 Family history of malignant neoplasm of ovary: Secondary | ICD-10-CM | POA: Diagnosis not present

## 2012-03-11 DIAGNOSIS — N9489 Other specified conditions associated with female genital organs and menstrual cycle: Secondary | ICD-10-CM | POA: Diagnosis not present

## 2012-03-11 LAB — CBC
MCV: 89.6 fL (ref 78.0–100.0)
Platelets: 179 10*3/uL (ref 150–400)
RBC: 4.13 MIL/uL (ref 3.87–5.11)
WBC: 5.5 10*3/uL (ref 4.0–10.5)

## 2012-03-12 ENCOUNTER — Ambulatory Visit (HOSPITAL_BASED_OUTPATIENT_CLINIC_OR_DEPARTMENT_OTHER)
Admission: RE | Admit: 2012-03-12 | Discharge: 2012-03-12 | Disposition: A | Discharge status: Home / Self Care | Payer: Medicare Other | Source: Ambulatory Visit | Attending: Obstetrics and Gynecology | Admitting: Obstetrics and Gynecology

## 2012-03-12 ENCOUNTER — Ambulatory Visit (HOSPITAL_BASED_OUTPATIENT_CLINIC_OR_DEPARTMENT_OTHER): Payer: Medicare Other | Admitting: Anesthesiology

## 2012-03-12 ENCOUNTER — Encounter (HOSPITAL_BASED_OUTPATIENT_CLINIC_OR_DEPARTMENT_OTHER): Payer: Self-pay | Admitting: Anesthesiology

## 2012-03-12 ENCOUNTER — Encounter (HOSPITAL_BASED_OUTPATIENT_CLINIC_OR_DEPARTMENT_OTHER): Payer: Self-pay | Admitting: *Deleted

## 2012-03-12 ENCOUNTER — Encounter (HOSPITAL_BASED_OUTPATIENT_CLINIC_OR_DEPARTMENT_OTHER)
Admission: RE | Disposition: A | Discharge status: Home / Self Care | Payer: Self-pay | Source: Ambulatory Visit | Attending: Obstetrics and Gynecology

## 2012-03-12 DIAGNOSIS — N838 Other noninflammatory disorders of ovary, fallopian tube and broad ligament: Secondary | ICD-10-CM | POA: Insufficient documentation

## 2012-03-12 DIAGNOSIS — Z8041 Family history of malignant neoplasm of ovary: Secondary | ICD-10-CM | POA: Insufficient documentation

## 2012-03-12 DIAGNOSIS — Z853 Personal history of malignant neoplasm of breast: Secondary | ICD-10-CM | POA: Diagnosis not present

## 2012-03-12 DIAGNOSIS — N9489 Other specified conditions associated with female genital organs and menstrual cycle: Secondary | ICD-10-CM | POA: Diagnosis not present

## 2012-03-12 DIAGNOSIS — M810 Age-related osteoporosis without current pathological fracture: Secondary | ICD-10-CM | POA: Insufficient documentation

## 2012-03-12 DIAGNOSIS — Z90721 Acquired absence of ovaries, unilateral: Secondary | ICD-10-CM

## 2012-03-12 DIAGNOSIS — Z1502 Genetic susceptibility to malignant neoplasm of ovary: Secondary | ICD-10-CM

## 2012-03-12 DIAGNOSIS — Z79899 Other long term (current) drug therapy: Secondary | ICD-10-CM | POA: Insufficient documentation

## 2012-03-12 DIAGNOSIS — N736 Female pelvic peritoneal adhesions (postinfective): Secondary | ICD-10-CM | POA: Diagnosis not present

## 2012-03-12 DIAGNOSIS — R899 Unspecified abnormal finding in specimens from other organs, systems and tissues: Secondary | ICD-10-CM | POA: Diagnosis not present

## 2012-03-12 HISTORY — DX: Personal history of other diseases of the circulatory system: Z86.79

## 2012-03-12 HISTORY — PX: CYSTOSCOPY: SHX5120

## 2012-03-12 HISTORY — DX: Frequency of micturition: R35.0

## 2012-03-12 HISTORY — PX: LAPAROSCOPIC BILATERAL SALPINGO OOPHERECTOMY: SHX5890

## 2012-03-12 HISTORY — DX: Personal history of malignant neoplasm of breast: Z85.3

## 2012-03-12 HISTORY — DX: Palpitations: R00.2

## 2012-03-12 HISTORY — DX: Nocturia: R35.1

## 2012-03-12 SURGERY — SALPINGO-OOPHORECTOMY, BILATERAL, LAPAROSCOPIC
Anesthesia: General | Site: Bladder | Wound class: Clean

## 2012-03-12 MED ORDER — LIDOCAINE HCL (CARDIAC) 20 MG/ML IV SOLN
INTRAVENOUS | Status: DC | PRN
  Administered 2012-03-12: 80 mg via INTRAVENOUS

## 2012-03-12 MED ORDER — LACTATED RINGERS IR SOLN
Status: DC | PRN
  Administered 2012-03-12: 3000 mL

## 2012-03-12 MED ORDER — HYDROMORPHONE HCL 2 MG PO TABS
2.0000 mg | ORAL_TABLET | Freq: Four times a day (QID) | ORAL | Status: DC | PRN
  Administered 2012-03-12: 2 mg via ORAL
  Filled 2012-03-12: qty 1

## 2012-03-12 MED ORDER — FENTANYL CITRATE 0.05 MG/ML IJ SOLN
INTRAMUSCULAR | Status: DC | PRN
  Administered 2012-03-12 (×2): 50 ug via INTRAVENOUS

## 2012-03-12 MED ORDER — BUPIVACAINE HCL (PF) 0.25 % IJ SOLN
INTRAMUSCULAR | Status: DC | PRN
  Administered 2012-03-12: 10 mL

## 2012-03-12 MED ORDER — GLYCOPYRROLATE 0.2 MG/ML IJ SOLN
INTRAMUSCULAR | Status: DC | PRN
  Administered 2012-03-12: 0.2 mg via INTRAVENOUS

## 2012-03-12 MED ORDER — LACTATED RINGERS IV SOLN
INTRAVENOUS | Status: DC
  Filled 2012-03-12: qty 1000

## 2012-03-12 MED ORDER — LACTATED RINGERS IV SOLN
INTRAVENOUS | Status: DC
  Administered 2012-03-12: 100 mL/h via INTRAVENOUS
  Filled 2012-03-12: qty 1000

## 2012-03-12 MED ORDER — ONDANSETRON HCL 4 MG/2ML IJ SOLN
INTRAMUSCULAR | Status: DC | PRN
  Administered 2012-03-12: 4 mg via INTRAVENOUS

## 2012-03-12 MED ORDER — DEXAMETHASONE SODIUM PHOSPHATE 4 MG/ML IJ SOLN
INTRAMUSCULAR | Status: DC | PRN
  Administered 2012-03-12: 10 mg via INTRAVENOUS

## 2012-03-12 MED ORDER — PROMETHAZINE HCL 25 MG/ML IJ SOLN
6.2500 mg | INTRAMUSCULAR | Status: DC | PRN
  Administered 2012-03-12: 6.25 mg via INTRAVENOUS
  Filled 2012-03-12: qty 1

## 2012-03-12 MED ORDER — FENTANYL CITRATE 0.05 MG/ML IJ SOLN
25.0000 ug | INTRAMUSCULAR | Status: DC | PRN
  Administered 2012-03-12 (×2): 25 ug via INTRAVENOUS
  Filled 2012-03-12: qty 1

## 2012-03-12 MED ORDER — NEOSTIGMINE METHYLSULFATE 1 MG/ML IJ SOLN
INTRAMUSCULAR | Status: DC | PRN
  Administered 2012-03-12: 1 mg via INTRAVENOUS

## 2012-03-12 MED ORDER — HEPARIN SOD (PORK) LOCK FLUSH 10 UNIT/ML IV SOLN
INTRAVENOUS | Status: DC | PRN
  Administered 2012-03-12: 10 [IU]

## 2012-03-12 MED ORDER — ROCURONIUM BROMIDE 100 MG/10ML IV SOLN
INTRAVENOUS | Status: DC | PRN
  Administered 2012-03-12: 40 mg via INTRAVENOUS

## 2012-03-12 MED ORDER — CIPROFLOXACIN IN D5W 400 MG/200ML IV SOLN
400.0000 mg | INTRAVENOUS | Status: AC
  Administered 2012-03-12: 400 mg via INTRAVENOUS
  Filled 2012-03-12: qty 200

## 2012-03-12 MED ORDER — PROPOFOL 10 MG/ML IV BOLUS
INTRAVENOUS | Status: DC | PRN
  Administered 2012-03-12: 160 mg via INTRAVENOUS

## 2012-03-12 MED ORDER — ACETAMINOPHEN 10 MG/ML IV SOLN
INTRAVENOUS | Status: DC | PRN
  Administered 2012-03-12: 1000 mg via INTRAVENOUS

## 2012-03-12 MED ORDER — INDIGOTINDISULFONATE SODIUM 8 MG/ML IJ SOLN
INTRAMUSCULAR | Status: DC | PRN
  Administered 2012-03-12: 5 mL via INTRAVENOUS

## 2012-03-12 MED ORDER — CLINDAMYCIN PHOSPHATE 900 MG/50ML IV SOLN
900.0000 mg | INTRAVENOUS | Status: AC
  Administered 2012-03-12: 900 mg via INTRAVENOUS
  Filled 2012-03-12: qty 50

## 2012-03-12 MED ORDER — MEPERIDINE HCL 25 MG/ML IJ SOLN
6.2500 mg | INTRAMUSCULAR | Status: DC | PRN
  Administered 2012-03-12: 6.25 mg via INTRAVENOUS
  Filled 2012-03-12: qty 1

## 2012-03-12 SURGICAL SUPPLY — 63 items
ADH SKN CLS APL DERMABOND .7 (GAUZE/BANDAGES/DRESSINGS) ×2
APPLICATOR COTTON TIP 6IN STRL (MISCELLANEOUS) ×3 IMPLANT
BAG DRAIN URO-CYSTO SKYTR STRL (DRAIN) ×2 IMPLANT
BAG DRN UROCATH (DRAIN)
BAG SPEC RTRVL LRG 6X4 10 (ENDOMECHANICALS) ×4
BAG URINE DRAINAGE (UROLOGICAL SUPPLIES) ×1 IMPLANT
BANDAGE ADHESIVE 1X3 (GAUZE/BANDAGES/DRESSINGS) IMPLANT
BLADE SURG 11 STRL SS (BLADE) ×3 IMPLANT
CANISTER SUCT LVC 12 LTR MEDI- (MISCELLANEOUS) IMPLANT
CANISTER SUCTION 1200CC (MISCELLANEOUS) IMPLANT
CANISTER SUCTION 2500CC (MISCELLANEOUS) ×2 IMPLANT
CATH FOLEY 2WAY SLVR  5CC 14FR (CATHETERS) ×1
CATH FOLEY 2WAY SLVR 5CC 14FR (CATHETERS) IMPLANT
CATH ROBINSON RED A/P 16FR (CATHETERS) IMPLANT
CLOTH BEACON ORANGE TIMEOUT ST (SAFETY) ×3 IMPLANT
DERMABOND ADVANCED (GAUZE/BANDAGES/DRESSINGS) ×1
DERMABOND ADVANCED .7 DNX12 (GAUZE/BANDAGES/DRESSINGS) ×2 IMPLANT
DRAPE CAMERA CLOSED 9X96 (DRAPES) ×3 IMPLANT
DRESSING TELFA 8X3 (GAUZE/BANDAGES/DRESSINGS) IMPLANT
DRSG TEGADERM 2-3/8X2-3/4 SM (GAUZE/BANDAGES/DRESSINGS) ×1 IMPLANT
ELECT REM PT RETURN 9FT ADLT (ELECTROSURGICAL) ×3
ELECTRODE REM PT RTRN 9FT ADLT (ELECTROSURGICAL) ×2 IMPLANT
FILTER SMOKE EVAC LAPAROSHD (FILTER) IMPLANT
GLOVE BIO SURGEON STRL SZ 6.5 (GLOVE) ×1 IMPLANT
GLOVE BIO SURGEON STRL SZ7 (GLOVE) ×6 IMPLANT
GLOVE ECLIPSE 6.0 STRL STRAW (GLOVE) ×1 IMPLANT
GOWN PREVENTION PLUS LG XLONG (DISPOSABLE) ×6 IMPLANT
GOWN STRL REIN XL XLG (GOWN DISPOSABLE) ×3 IMPLANT
GOWN SURGICAL LARGE (GOWNS) ×1 IMPLANT
IV LACTATED RINGER IRRG 3000ML (IV SOLUTION) ×3
IV LR IRRIG 3000ML ARTHROMATIC (IV SOLUTION) IMPLANT
IV NS 1000ML (IV SOLUTION) ×3
IV NS 1000ML BAXH (IV SOLUTION) IMPLANT
LAPAROSCOPY HANDPIECE LONG (MISCELLANEOUS) IMPLANT
NDL INSUFFLATION 14GA 120MM (NEEDLE) IMPLANT
NEEDLE INSUFFLATION 14GA 120MM (NEEDLE) IMPLANT
NS IRRIG 500ML POUR BTL (IV SOLUTION) ×3 IMPLANT
PACK BASIN DAY SURGERY FS (CUSTOM PROCEDURE TRAY) ×3 IMPLANT
PACK LAPAROSCOPY II (CUSTOM PROCEDURE TRAY) ×3 IMPLANT
PAD OB MATERNITY 4.3X12.25 (PERSONAL CARE ITEMS) ×3 IMPLANT
PAD PREP 24X48 CUFFED NSTRL (MISCELLANEOUS) ×4 IMPLANT
POUCH SPECIMEN RETRIEVAL 10MM (ENDOMECHANICALS) ×2 IMPLANT
SCISSORS LAP 5X35 DISP (ENDOMECHANICALS) IMPLANT
SEALER TISSUE G2 CVD JAW 35 (ENDOMECHANICALS) IMPLANT
SEALER TISSUE G2 CVD JAW 45CM (ENDOMECHANICALS) ×1 IMPLANT
SET IRRIG TUBING LAPAROSCOPIC (IRRIGATION / IRRIGATOR) ×1 IMPLANT
SET IRRIG Y TYPE TUR BLADDER L (SET/KITS/TRAYS/PACK) ×3 IMPLANT
SOLUTION ANTI FOG 6CC (MISCELLANEOUS) ×3 IMPLANT
SPONGE GAUZE 2X2 8PLY STRL LF (GAUZE/BANDAGES/DRESSINGS) ×1 IMPLANT
SUT VIC AB 3-0 PS2 18 (SUTURE) ×3
SUT VIC AB 3-0 PS2 18XBRD (SUTURE) ×2 IMPLANT
SUT VICRYL 0 ENDOLOOP (SUTURE) IMPLANT
SUT VICRYL 0 UR6 27IN ABS (SUTURE) ×1 IMPLANT
SYRINGE 10CC LL (SYRINGE) ×1 IMPLANT
TOWEL OR 17X24 6PK STRL BLUE (TOWEL DISPOSABLE) ×6 IMPLANT
TRAY DSU PREP LF (CUSTOM PROCEDURE TRAY) ×3 IMPLANT
TROCAR BALLN 12MMX100 BLUNT (TROCAR) ×1 IMPLANT
TROCAR Z-THREAD BLADED 11X100M (TROCAR) ×4 IMPLANT
TROCAR Z-THREAD BLADED 5X100MM (TROCAR) ×3 IMPLANT
TUBING INSUFFLATION W/FILTER (TUBING) ×3 IMPLANT
VACUUM HOSE/TUBING 7/8INX6FT (MISCELLANEOUS) IMPLANT
WATER STERILE IRR 3000ML UROMA (IV SOLUTION) ×2 IMPLANT
WATER STERILE IRR 500ML POUR (IV SOLUTION) ×3 IMPLANT

## 2012-03-12 NOTE — Transfer of Care (Signed)
Immediate Anesthesia Transfer of Care Note  Patient: [name]  Procedure(s) Performed: Procedure(s) (LRB) with comments: LAPAROSCOPIC BILATERAL SALPINGO OOPHORECTOMY (Bilateral) CYSTOSCOPY (N/A)  Patient Location: PACU  Anesthesia Type:General  Level of Consciousness: sedated and responds to stimulation  Airway & Oxygen Therapy: Patient Spontanous Breathing and Patient connected to nasal cannula oxygen  Post-op Assessment: Report given to PACU RN  Post vital signs: Reviewed and stable  Complications: No apparent anesthesia complications

## 2012-03-12 NOTE — Anesthesia Preprocedure Evaluation (Addendum)
Anesthesia Evaluation  Patient identified by MRN, date of birth, ID band Patient awake    Reviewed: Allergy & Precautions, H&P , NPO status , Patient's Chart, lab work & pertinent test results  Airway Mallampati: II TM Distance: >3 FB Neck ROM: Full    Dental No notable dental hx. (+) Dental Advisory Given   Pulmonary neg pulmonary ROS,  breath sounds clear to auscultation  Pulmonary exam normal       Cardiovascular negative cardio ROS  Rhythm:Regular Rate:Normal     Neuro/Psych negative neurological ROS  negative psych ROS   GI/Hepatic negative GI ROS, Neg liver ROS,   Endo/Other  negative endocrine ROS  Renal/GU negative Renal ROS  negative genitourinary   Musculoskeletal negative musculoskeletal ROS (+)   Abdominal   Peds negative pediatric ROS (+)  Hematology negative hematology ROS (+)   Anesthesia Other Findings Upper front caps  Reproductive/Obstetrics negative OB ROS                           Anesthesia Physical Anesthesia Plan  ASA: I  Anesthesia Plan: General   Post-op Pain Management:    Induction: Intravenous  Airway Management Planned: Oral ETT  Additional Equipment:   Intra-op Plan:   Post-operative Plan: Extubation in OR  Informed Consent: I have reviewed the patients History and Physical, chart, labs and discussed the procedure including the risks, benefits and alternatives for the proposed anesthesia with the patient or authorized representative who has indicated his/her understanding and acceptance.   Dental advisory given  Plan Discussed with: CRNA  Anesthesia Plan Comments:         Anesthesia Quick Evaluation

## 2012-03-12 NOTE — Brief Op Note (Signed)
03/12/2012  8:52 AM  PATIENT:  Little Ishikawa Getchell  71 y.o. female  PRE-OPERATIVE DIAGNOSIS:  increased risk of ovarian cancer  POST-OPERATIVE DIAGNOSIS:  increased risk of ovarian cancer  PROCEDURE:  Procedure(s) (LRB) with comments: LAPAROSCOPIC BILATERAL SALPINGO OOPHORECTOMY (Bilateral) CYSTOSCOPY (N/A)  SURGEON:  Surgeon(s) and Role:    * Juluis Mire, MD - Primary  PHYSICIAN ASSISTANT:   ASSISTANTS: none   ANESTHESIA:   general  EBL:  Total I/O In: 100 [I.V.:100] Out: -   BLOOD ADMINISTERED:none  DRAINS: none   LOCAL MEDICATIONS USED:  XYLOCAINE   SPECIMEN:  Source of Specimen:  pelvic washings  both tubes and ovaries   DISPOSITION OF SPECIMEN:  PATHOLOGY  COUNTS:  YES  TOURNIQUET:  * No tourniquets in log *  DICTATION: .Other Dictation: Dictation Number [id number]  PLAN OF CARE: Discharge to home after PACU  PATIENT DISPOSITION:  PACU - hemodynamically stable.   Delay start of Pharmacological VTE agent (>24hrs) due to surgical blood loss or risk of bleeding: no

## 2012-03-12 NOTE — Progress Notes (Signed)
Received call from son.  Son states "mom is sob, has pain in her shoulder and back , under her diaphragm and ribs."  Son informed that the description sounds like she has referred gas pain  Which  Can be a side effect of the procedure.. The CO2 is used during the procedure and the Dr. Tillie Rung to suction  It out before the end of procedure.  Lying flat with heating pad to shoulder may help, but it won't go away until the gas dissipates.  Son informed that the office usually covers expectations with the pt.  If she feels that she cannot tolerate the discomfort, then by all means, you have the option of calling 911 and take her to the nearest ER.  Son verbalized his understanding.  When asked if he has called the office.  His response was no,  Son encouraged to call the office and talk w them.

## 2012-03-12 NOTE — Anesthesia Procedure Notes (Signed)
Procedure Name: Intubation Date/Time: 03/12/2012 7:37 AM Performed by: Maris Berger T Pre-anesthesia Checklist: Patient identified, Emergency Drugs available, Suction available and Patient being monitored Patient Re-evaluated:Patient Re-evaluated prior to inductionOxygen Delivery Method: Circle System Utilized Preoxygenation: Pre-oxygenation with 100% oxygen Intubation Type: IV induction Ventilation: Mask ventilation without difficulty Laryngoscope Size: Mac and 3 Grade View: Grade II Tube type: Oral Number of attempts: 1 Airway Equipment and Method: stylet and oral airway Placement Confirmation: ETT inserted through vocal cords under direct vision,  positive ETCO2 and breath sounds checked- equal and bilateral Secured at: 22 cm Tube secured with: Tape Dental Injury: Teeth and Oropharynx as per pre-operative assessment

## 2012-03-12 NOTE — Anesthesia Postprocedure Evaluation (Signed)
  Anesthesia Post-op Note  Patient: [name]  Procedure(s) Performed: Procedure(s) (LRB): LAPAROSCOPIC BILATERAL SALPINGO OOPHORECTOMY (Bilateral) CYSTOSCOPY (N/A)  Patient Location: PACU  Anesthesia Type: General  Level of Consciousness: awake and alert   Airway and Oxygen Therapy: Patient Spontanous Breathing  Post-op Pain: mild  Post-op Assessment: Post-op Vital signs reviewed, Patient's Cardiovascular Status Stable, Respiratory Function Stable, Patent Airway and No signs of Nausea or vomiting  Last Vitals:  Filed Vitals:   03/12/12 0900  BP: 132/62  Pulse: 51  Temp: 35.8 C  Resp: 14    Post-op Vital Signs: stable   Complications: No apparent anesthesia complications

## 2012-03-12 NOTE — Op Note (Signed)
Patient name  [name], [name] [id number] CSN# [id number]  [facility], MD 03/12/2012 8:54 AM

## 2012-03-12 NOTE — H&P (Signed)
  History and physical exam unchanged 

## 2012-03-12 NOTE — Addendum Note (Signed)
Addendum  created 03/12/12 1137 by Briant Sites, CRNA   Modules edited:Charges VN

## 2012-03-13 ENCOUNTER — Encounter (HOSPITAL_BASED_OUTPATIENT_CLINIC_OR_DEPARTMENT_OTHER): Payer: Self-pay | Admitting: Obstetrics and Gynecology

## 2012-03-13 NOTE — Op Note (Signed)
[name], [name] NO.:  [id number]  MEDICAL RECORD NO.:  [id number]  LOCATION:                                 FACILITY:  PHYSICIAN:  Juluis Mire, M.D.        DATE OF BIRTH:  DATE OF PROCEDURE:  03/12/2012 DATE OF DISCHARGE:                              OPERATIVE REPORT   PREOPERATIVE DIAGNOSIS:  Increased risk of ovarian cancer.  POSTOPERATIVE DIAGNOSIS:  Increased risk of ovarian cancer.  PROCEDURE:  Laparoscopic bilateral salpingo-oophorectomy.  Cystoscopy.  SURGEON:  Juluis Mire, MD  ANESTHESIA:  General endotracheal.  ESTIMATED BLOOD LOSS:  Minimal.  PACKS AND DRAINS:  None.  INTRAOPERATIVE BLOOD PLACED:  None.  COMPLICATIONS:  None.  INDICATIONS:  As dictated in history and physical.  DESCRIPTION OF PROCEDURE:  The patient was taken to OR, placed in supine position.  After satisfactory level of general endotracheal anesthesia obtained, the patient was placed in dorsal lithotomy position using Allen stirrups.  The abdomen, perineum, and vagina were prepped out with Betadine.  Bladder was emptied via catheterization.  A Hulka tenaculum was put in place and secured.  The patient was then draped in sterile field.  Subumbilical incision made with a knife.  Veress needle was introduced into abdominal cavity.  The abdomen was insufflated with approximately 3 L of carbon dioxide.  A 10/11 trocar was introduced along with laparoscope.  There was no evidence of injury to adjacent organs.  A 10/11 trocar was put in place in the suprapubic area. Visualization revealed the uterus, tubes, and ovaries to be unremarkable.  She did have some omental adhesions to the lower pelvic area.  These were taken down using the EnSeal.  We did obtain pelvic washings that were sent for cytology.  Appendix was visualized and noted to be normal.  The upper abdomen including the liver and tip of the gallbladder were clear.  Both lateral gutters were clear.  We  first went to the left side, identified the ureter.  We elevated the ovary and tube using the EnSeal.  We cauterized and incised the ovarian vasculature. We cauterized and incised the mesenteric attachments of the tube and ovary up to the uterus.  We then cauterized and incised the utero- ovarian pedicle and remaining tube.  Of note, a 5 mm trocar was put in place in the left lower quadrant prior to this.  This was done under direct visualization.  At this point in time, the endobag was inserted. The left tube and ovary were placed in endobag and removed through the suprapubic incisions and sent for pathological review.  Next we elevated the right ovary.  We identified the ureter, passed along the right pelvic sidewall.  Again using the EnSeal, we cauterized and incised the ovarian vasculature.  We cauterized and incised the mesenteric attachments, the tubes and ovaries up to the uterus.  We then cauterized and incised the utero-ovarian pedicle.  The endobag was again inserted through a suprapubic area.  The right tube and ovary placed in endobag and removed through the suprapubic port.  This was sent for pathology separately.  At this point in time, we irrigated  the pelvis.  We used a bipolar to bring out hemostasis and small areas were cauterized excessively along the insertion of the tube into the uterus therefore destroying any remaining tube.  We deflated the abdomen. Revisualization revealed no active bleeding.  We thoroughly irrigated again and had no active bleeding.  All the irrigation was removed. Abdomen was deflated with carbon dioxide.  All laparoscopic trocars removed.  Subumbilical incision was closed with interrupted subcuticulars of 4-0 Vicryl.  Suprapubic incision was closed with interrupted subcuticulars of 4-0 Vicryl and Dermabond.  Left lower quadrant was closed with Dermabond.  The Hulka tenaculum was then removed.  The patient had been given indigo carmine.   Cystoscopic evaluation revealed no injury to the bladder.  Both ureteral orifices were seen to be spilling blue dye indicating no ureteral obstruction.  At this point in time, the bladder was emptied of its irrigation.  The cystoscope had been removed.  The patient was taken out of dorsal lithotomy position. Once alert and extubated, transferred to recovery room in good condition.  Sponge, instrument, and needle count was reported correct by circulating nurse x2.  The patient tolerated the procedure well and returned to recovery room in good condition.     Juluis Mire, M.D.     JSM/MEDQ  D:  03/12/2012  T:  03/12/2012  Job:  [id number]

## 2012-04-03 DIAGNOSIS — H10509 Unspecified blepharoconjunctivitis, unspecified eye: Secondary | ICD-10-CM | POA: Diagnosis not present

## 2012-04-08 DIAGNOSIS — Z85828 Personal history of other malignant neoplasm of skin: Secondary | ICD-10-CM | POA: Diagnosis not present

## 2012-04-08 DIAGNOSIS — D239 Other benign neoplasm of skin, unspecified: Secondary | ICD-10-CM | POA: Diagnosis not present

## 2012-05-17 DIAGNOSIS — J069 Acute upper respiratory infection, unspecified: Secondary | ICD-10-CM | POA: Diagnosis not present

## 2012-05-17 DIAGNOSIS — J029 Acute pharyngitis, unspecified: Secondary | ICD-10-CM | POA: Diagnosis not present

## 2012-05-19 ENCOUNTER — Other Ambulatory Visit (HOSPITAL_BASED_OUTPATIENT_CLINIC_OR_DEPARTMENT_OTHER): Payer: Medicare Other | Admitting: Lab

## 2012-05-19 ENCOUNTER — Telehealth: Payer: Self-pay | Admitting: Oncology

## 2012-05-19 ENCOUNTER — Ambulatory Visit (HOSPITAL_BASED_OUTPATIENT_CLINIC_OR_DEPARTMENT_OTHER): Payer: Medicare Other | Admitting: Oncology

## 2012-05-19 VITALS — BP 101/64 | HR 61 | Temp 97.4°F | Resp 20 | Ht 65.0 in | Wt 135.6 lb

## 2012-05-19 DIAGNOSIS — C50219 Malignant neoplasm of upper-inner quadrant of unspecified female breast: Secondary | ICD-10-CM | POA: Diagnosis not present

## 2012-05-19 DIAGNOSIS — C50419 Malignant neoplasm of upper-outer quadrant of unspecified female breast: Secondary | ICD-10-CM

## 2012-05-19 DIAGNOSIS — Z171 Estrogen receptor negative status [ER-]: Secondary | ICD-10-CM

## 2012-05-19 DIAGNOSIS — R17 Unspecified jaundice: Secondary | ICD-10-CM

## 2012-05-19 LAB — CBC WITH DIFFERENTIAL/PLATELET
BASO%: 0.8 % (ref 0.0–2.0)
EOS%: 2.4 % (ref 0.0–7.0)
HGB: 14 g/dL (ref 11.6–15.9)
MCH: 31.4 pg (ref 25.1–34.0)
MCHC: 34.5 g/dL (ref 31.5–36.0)
MONO#: 0.4 10*3/uL (ref 0.1–0.9)
RDW: 13.1 % (ref 11.2–14.5)
WBC: 4.5 10*3/uL (ref 3.9–10.3)
lymph#: 1.4 10*3/uL (ref 0.9–3.3)

## 2012-05-19 LAB — COMPREHENSIVE METABOLIC PANEL (CC13)
ALT: 22 U/L (ref 0–55)
AST: 17 U/L (ref 5–34)
Albumin: 3.6 g/dL (ref 3.5–5.0)
CO2: 28 mEq/L (ref 22–29)
Calcium: 10.4 mg/dL (ref 8.4–10.4)
Chloride: 106 mEq/L (ref 98–107)
Potassium: 4.3 mEq/L (ref 3.5–5.1)
Total Protein: 7.2 g/dL (ref 6.4–8.3)

## 2012-05-19 NOTE — Progress Notes (Signed)
ID: [name]   DOB: 1940-10-13  MR#: [id number]  MVH#:[id number]  PCP: Lolita Patella, MD GYN: Richardean Chimera SU: Cicero Duck OTHER MD: Janalyn Harder  HISTORY OF PRESENT ILLNESS: [name] had neglected her health maintenance for the last several years, partly because she has been so concerned about her sister who died from pancreatic cancer this past March. At any rate, recently she was putting on her bra and noticed a change in the lateral aspect of her right breast. She brought it to Dr. Benjaman Pott attention, and she was set up for bilateral diagnostic mammography at the breast center November 21, 2010. This found her breasts to be heterogeneously dense, and in the upper outer quadrant there was an irregular mass measuring approximately 2.7 cm.   On physical exam, Dr. Renae Gloss was able to palpate a soft mass measuring approximately 2 cm which on ultrasound had irregular borders and was hypoechoic. It measured 1.7 cm sonographically. Evaluation of the right axilla was negative.   On the same day, the patient proceeded to ultrasound-guided core biopsy, and the pathology from this procedure ([id number]) showed an invasive mammary carcinoma with histiocytoid features, triple negative, with a proliferation marker by MIB-1 of 8%.   Ms. Cuneo case was discussed at the multidisciplinary breast cancer conference on September 26th. The cells were certainly unusual looking with a significant amount of cytoplasmic vacuolation. The fact that she has one of the so called "low-grade triple negative tumors" led to much discussion, and the feeling was that chemotherapy was not automatic in this case but certainly needed to be considered.    The patient underwent right lumpectomy and sentinel lymph node sampling on 01/03/2011 for a T2N0, stage II breast carcinoma.  She decided to forgo chemotherapy. She received radiation therapy, completed mid December of 2012. Her subsequent history is as detailed  below.  INTERVAL HISTORY: Malley returns today for routine followup of her right breast carcinoma. Since her last visit here she had bilateral salpingo-oophorectomy with benign pathology. She also had a basal cell removed.  REVIEW OF SYSTEMS: She has an itchy rash on her mid back that she wanted me to look at. Just a [name] bit of sinus trouble, sore throat, and postnasal drip, but no cough shortness of breath fever or hemoptysis. She uses a treadmill for exercise. A detailed review of systems today was otherwise entirely negative   PAST MEDICAL HISTORY: Past Medical History  Diagnosis Date  . Hyperlipidemia   . Depression     mild  . History of breast cancer DX 11-21-2011--  S/P LUMPECTOMY AND RADIATION -- FOLLOWED BY DR Arlice Colt  . History of hypotension   . Heart palpitations occasional -- avoids caffine  . Frequency of urination   . Nocturia     PAST SURGICAL HISTORY: Past Surgical History  Procedure Laterality Date  . Breast lumpectomy w/ needle localization  01/03/2011    Right, w [facility] Dr Jamey Ripa  . Breast surgery  1962    removal benign left breast tumor   . Hysteroscopy w/ endometrial ablation  1980  . Laparoscopic bilateral salpingo oopherectomy  03/12/2012    Procedure: LAPAROSCOPIC BILATERAL SALPINGO OOPHORECTOMY;  Surgeon: Juluis Mire, MD;  Location: [facility] [place];  Service: Gynecology;  Laterality: Bilateral;  . Cystoscopy  03/12/2012    Procedure: CYSTOSCOPY;  Surgeon: Juluis Mire, MD;  Location: [facility];  Service: Gynecology;  Laterality: N/A;    FAMILY HISTORY Family History  Problem Relation Age of Onset  .  Cancer Mother     stomach  . Cancer Maternal Uncle     colon  . Cancer Sister     pancreatic  There is no history of breast cancer in the family. There is no history of ovarian cancer in the family to her knowledge.   GYNECOLOGIC HISTORY: Menarche at age 34, last period in 17. She is GX, P4, first live birth at age  88. She never used hormones or birth control pills.   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 Her daughter, [name], lives in [place], [place]. 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 Daughter, [name], lives in [place], and she has a Education administrator in social work working primarily with prisoners. Son [name],  lives in [place]. He is a Naval architect. Son [name], also lives in [place] and works at [employer] in Office manager. The patient attends [place]. [employer]. She has 6 grandchildren.      ADVANCED DIRECTIVES:  HEALTH MAINTENANCE: History  Substance Use Topics  . Smoking status: Former Smoker -- 1.00 packs/day for 20 years    Types: Cigarettes    Quit date: 03/11/1979  . Smokeless tobacco: Never Used  . Alcohol Use: No     Colonoscopy: 2011, [facility]  PAP: approx 2010  Bone density: 2013, osteoporosis  Lipid panel:   Allergies  Allergen Reactions  . Contrast Media (Iodinated Diagnostic Agents) Anaphylaxis    AREA INFLAMMED AND SWELLING W/ BREAST NEEDLE LOCALIZATION  . Penicillins Other (See Comments)    Patient does not know of reaction. Was based on a scratch test.    Current Outpatient Prescriptions  Medication Sig Dispense Refill  . calcium carbonate (TUMS EX) 750 MG chewable tablet Chew 2 tablets by mouth daily.      . Cholecalciferol (D3-1000 PO) Take 1 capsule by mouth daily.      . Multiple Vitamin (MULTIVITAMIN) tablet Take 1 tablet by mouth daily.      . Omega-3 Fatty Acids (FISH OIL) 1200 MG CAPS Take 1 capsule by mouth daily.      . pravastatin (PRAVACHOL) 40 MG tablet Take 40 mg by mouth every evening.        No current facility-administered medications for this visit.    OBJECTIVE: Middle-aged white woman who appears well Filed  Vitals:   05/19/12 1114  BP: 101/64  Pulse: 61  Temp: 97.4 F (36.3 C)  Resp: 20     Body mass index is 22.57 kg/(m^2).    ECOG FS: 0 Filed Weights   05/19/12 1114  Weight: 135 lb 9.6 oz (61.508 kg)    Physical Exam: HEENT:  Sclerae anicteric. Oropharynx clear.   Nodes:  No cervical, supraclavicular, or axillary lymphadenopathy palpated.  Breast Exam:  Right breast is status post lumpectomy with well-healed incision. No evidence of local recurrence. Left breast unremarkable. Axillae are benign bilaterally with no adenopathy. Lungs:  Clear to auscultation bilaterally.  No crackles, rhonchi, or wheezes.   Heart:  Regular rate and rhythm.  No murmur appreciated Abdomen:  Soft, nontender.  Positive bowel sounds.  No organomegaly or masses palpated.   Musculoskeletal:  No focal spinal tenderness to palpation.  Extremities: No peripheral edema or cyanosis.   Neuro:  Nonfocal.Well oriented. Positive affect Skin: The rash  on her upper back looks acneform, with a couple of excoriations. The skin is dry. There is no significant erythema, no swelling, and no suspicious lesions   LAB RESULTS: Lab Results  Component Value Date   WBC 4.5 05/19/2012   NEUTROABS 2.6 05/19/2012   HGB 14.0 05/19/2012   HCT 40.7 05/19/2012   MCV 91.2 05/19/2012   PLT 158 05/19/2012      Chemistry      Component Value Date/Time   NA 141 01/07/2012 1025   NA 140 10/08/2011 1132   K 4.4 01/07/2012 1025   K 4.3 10/08/2011 1132   CL 109* 01/07/2012 1025   CL 107 10/08/2011 1132   CO2 22 01/07/2012 1025   CO2 28 10/08/2011 1132   BUN 15.0 01/07/2012 1025   BUN 20 10/08/2011 1132   CREATININE 0.9 01/07/2012 1025   CREATININE 0.92 10/08/2011 1132      Component Value Date/Time   CALCIUM 10.0 01/07/2012 1025   CALCIUM 10.1 10/08/2011 1132   ALKPHOS 57 01/07/2012 1025   ALKPHOS 59 10/08/2011 1132   AST 18 01/07/2012 1025   AST 20 10/08/2011 1132   ALT 19 01/07/2012 1025   ALT 18 10/08/2011 1132   BILITOT 1.5* 01/07/2012 1330   BILITOT  1.80* 01/07/2012 1025     CMET was repeated today, results pending.  Lab Results  Component Value Date   LABCA2 25 12/31/2011    STUDIES: No results found.  ASSESSMENT: 72 y.o.  BRCA negative [place] woman   (1)  status post right lumpectomy and sentinel lymph node sampling 01/03/2011 for a T2 N0, stage IIA breast cancer with histiocytoid features, triple negative, grade 2, with an MIB-1 of 8%, the patient deciding to forgo chemotherapy  (2)  status post radiation completed mid December 2012  (3) status post bilateral salpingo-oophorectomy 03/12/2012 with benign pathology  PLAN: There is no evidence of breast cancer recurrence, and we're going to start seeing her on an every 6 month visit. We have repeat liver function tests and a ferritin pending today, and she will call us tomorrow for results. Otherwise I gave her a pamphlet on the lip strong program and encouraged her to participate. She will let us know how she did with it at the next visit here  Darwin Guastella C    05/19/2012

## 2012-05-19 NOTE — Telephone Encounter (Signed)
, °

## 2012-08-30 DIAGNOSIS — J209 Acute bronchitis, unspecified: Secondary | ICD-10-CM | POA: Diagnosis not present

## 2012-09-29 ENCOUNTER — Telehealth: Payer: Self-pay | Admitting: *Deleted

## 2012-09-29 NOTE — Telephone Encounter (Signed)
Pt called to this RN stating she needs letter per MD stating medically appropriate to have had her ovaries removed per her own history of breast ca, her sister who died with pancreatic cancer and her mother who died with stomach cancer.  Necha had her ovaries removed by Dr Arelia Sneddon with diagnosis as " high risk for ovarian ca ", but now medicare is denying payment due to "not appropriate ". Dr Lisbeth Ply office has not appealed decision but pt is proceeding with appeal herself.  Per [place] Sizer she was told by Dr Darnelle Catalan, Dr Jamey Ripa and Dr Dayton Scrape that she should have her ovaries removed and is hoping to obtain a letter indicating recommendation to send in with appeal.  Address letter to " to whom it may concern " and mail to pt's verified address.  This note with date of surgery will be given to MD.

## 2012-10-31 ENCOUNTER — Other Ambulatory Visit: Payer: Self-pay | Admitting: Oncology

## 2012-10-31 ENCOUNTER — Encounter: Payer: Self-pay | Admitting: Oncology

## 2012-11-06 ENCOUNTER — Other Ambulatory Visit: Payer: Self-pay

## 2012-11-06 ENCOUNTER — Other Ambulatory Visit: Payer: Self-pay | Admitting: Oncology

## 2012-11-06 DIAGNOSIS — Z853 Personal history of malignant neoplasm of breast: Secondary | ICD-10-CM

## 2012-11-18 ENCOUNTER — Other Ambulatory Visit: Payer: Self-pay | Admitting: Physician Assistant

## 2012-11-18 DIAGNOSIS — C50211 Malignant neoplasm of upper-inner quadrant of right female breast: Secondary | ICD-10-CM

## 2012-11-19 ENCOUNTER — Telehealth: Payer: Self-pay | Admitting: Oncology

## 2012-11-19 ENCOUNTER — Ambulatory Visit (HOSPITAL_BASED_OUTPATIENT_CLINIC_OR_DEPARTMENT_OTHER): Payer: Medicare Other | Admitting: Physician Assistant

## 2012-11-19 ENCOUNTER — Encounter: Payer: Self-pay | Admitting: Physician Assistant

## 2012-11-19 ENCOUNTER — Other Ambulatory Visit (HOSPITAL_BASED_OUTPATIENT_CLINIC_OR_DEPARTMENT_OTHER): Payer: Medicare Other | Admitting: Lab

## 2012-11-19 VITALS — BP 85/54 | HR 63 | Temp 97.7°F | Resp 18 | Ht 65.0 in | Wt 131.7 lb

## 2012-11-19 DIAGNOSIS — C50219 Malignant neoplasm of upper-inner quadrant of unspecified female breast: Secondary | ICD-10-CM

## 2012-11-19 DIAGNOSIS — Z1502 Genetic susceptibility to malignant neoplasm of ovary: Secondary | ICD-10-CM

## 2012-11-19 DIAGNOSIS — C50211 Malignant neoplasm of upper-inner quadrant of right female breast: Secondary | ICD-10-CM

## 2012-11-19 LAB — COMPREHENSIVE METABOLIC PANEL (CC13)
BUN: 20.3 mg/dL (ref 7.0–26.0)
CO2: 22 mEq/L (ref 22–29)
Calcium: 9.6 mg/dL (ref 8.4–10.4)
Chloride: 109 mEq/L (ref 98–109)
Creatinine: 0.9 mg/dL (ref 0.6–1.1)

## 2012-11-19 LAB — CBC WITH DIFFERENTIAL/PLATELET
BASO%: 0.8 % (ref 0.0–2.0)
HCT: 37.4 % (ref 34.8–46.6)
LYMPH%: 31.2 % (ref 14.0–49.7)
MCH: 31.5 pg (ref 25.1–34.0)
MCHC: 34.4 g/dL (ref 31.5–36.0)
MCV: 91.5 fL (ref 79.5–101.0)
MONO#: 0.3 10*3/uL (ref 0.1–0.9)
MONO%: 5.6 % (ref 0.0–14.0)
NEUT%: 60.6 % (ref 38.4–76.8)
Platelets: 168 10*3/uL (ref 145–400)
WBC: 4.6 10*3/uL (ref 3.9–10.3)

## 2012-11-19 LAB — BILIRUBIN, DIRECT: Bilirubin, Direct: 0.2 mg/dL (ref 0.0–0.3)

## 2012-11-19 NOTE — Telephone Encounter (Signed)
gv pt appt schedule for February 2015.  °

## 2012-11-19 NOTE — Progress Notes (Signed)
ID: [name]   DOB: November 02, 1940  MR#: [id number]  WGN#:[id number]  PCP: Lolita Patella, MD GYN: Richardean Chimera SU: Cicero Duck OTHER MD: Janalyn Harder  HISTORY OF PRESENT ILLNESS: [name] had neglected her health maintenance for the last several years, partly because she has been so concerned about her sister who died from pancreatic cancer this past March. At any rate, recently she was putting on her bra and noticed a change in the lateral aspect of her right breast. She brought it to Dr. Benjaman Pott attention, and she was set up for bilateral diagnostic mammography at the breast center November 21, 2010. This found her breasts to be heterogeneously dense, and in the upper outer quadrant there was an irregular mass measuring approximately 2.7 cm.   On physical exam, Dr. Renae Gloss was able to palpate a soft mass measuring approximately 2 cm which on ultrasound had irregular borders and was hypoechoic. It measured 1.7 cm sonographically. Evaluation of the right axilla was negative.   On the same day, the patient proceeded to ultrasound-guided core biopsy, and the pathology from this procedure ([id number]) showed an invasive mammary carcinoma with histiocytoid features, triple negative, with a proliferation marker by MIB-1 of 8%.   Ms. Whisman case was discussed at the multidisciplinary breast cancer conference on September 26th. The cells were certainly unusual looking with a significant amount of cytoplasmic vacuolation. The fact that she has one of the so called "low-grade triple negative tumors" led to much discussion, and the feeling was that chemotherapy was not automatic in this case but certainly needed to be considered.    The patient underwent right lumpectomy and sentinel lymph node sampling on 01/03/2011 for a T2N0, stage II breast carcinoma.  She decided to forgo chemotherapy. She received radiation therapy, completed mid December of 2012. Her subsequent history is as detailed  below.  INTERVAL HISTORY: [name] returns today for routine followup of her right breast carcinoma.  Interval history is generally unremarkable. She's had a good summer, and recently took a trip to [place] which she enjoyed. Physically, with the exception of an upper respiratory infection over the summer, she's had no new complaints and is feeling well.   REVIEW OF SYSTEMS: Denies any fevers or chills. She's had no night sweats. Her energy level is good, as is her appetite. She denies any nausea or change in bowel or bladder habits. She also denies cough, shortness of breath, chest pain, or palpitations. She's had no abnormal headaches or dizziness, and also denies any current myalgias, arthralgias, or bony pain.  A detailed review of systems is otherwise stable and noncontributory.   PAST MEDICAL HISTORY: Past Medical History  Diagnosis Date  . Hyperlipidemia   . Depression     mild  . History of breast cancer DX 11-21-2011--  S/P LUMPECTOMY AND RADIATION -- FOLLOWED BY DR Arlice Colt  . History of hypotension   . Heart palpitations occasional -- avoids caffine  . Frequency of urination   . Nocturia     PAST SURGICAL HISTORY: Past Surgical History  Procedure Laterality Date  . Breast lumpectomy w/ needle localization  01/03/2011    Right, w [facility] Dr Jamey Ripa  . Breast surgery  1962    removal benign left breast tumor   . Hysteroscopy w/ endometrial ablation  1980  . Laparoscopic bilateral salpingo oopherectomy  03/12/2012    Procedure: LAPAROSCOPIC BILATERAL SALPINGO OOPHORECTOMY;  Surgeon: Juluis Mire, MD;  Location: [facility] [place];  Service: Gynecology;  Laterality: Bilateral;  .  Cystoscopy  03/12/2012    Procedure: CYSTOSCOPY;  Surgeon: Juluis Mire, MD;  Location: [facility];  Service: Gynecology;  Laterality: N/A;    FAMILY HISTORY Family History  Problem Relation Age of Onset  . Cancer Mother     stomach  . Cancer Maternal Uncle     colon  .  Cancer Sister     pancreatic  There is no history of breast cancer in the family. There is no history of ovarian cancer in the family to her knowledge.   GYNECOLOGIC HISTORY: Menarche at age 4, last period in 9. She is GX, P4, first live birth at age 18. She never used hormones or birth control pills.   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 Her daughter, [name], lives in [place], [place]. 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 Daughter, [name], lives in [place], and she has a Education administrator in social work working primarily with prisoners. Son [name],  lives in [place]. He is a Naval architect. Son [name], also lives in [place] and works at [employer] in Office manager. The patient attends [place]. [employer]. She has 6 grandchildren.      ADVANCED DIRECTIVES:  HEALTH MAINTENANCE: History  Substance Use Topics  . Smoking status: Former Smoker -- 1.00 packs/day for 20 years    Types: Cigarettes    Quit date: 03/11/1979  . Smokeless tobacco: Never Used  . Alcohol Use: No     Colonoscopy: 2011, [facility]  PAP: approx 2010  Bone density: 2013, osteoporosis  Lipid panel:   Allergies  Allergen Reactions  . Contrast Media [Iodinated Diagnostic Agents] Anaphylaxis    AREA INFLAMMED AND SWELLING W/ BREAST NEEDLE LOCALIZATION  . Penicillins Other (See Comments)    Patient does not know of reaction. Was based on a scratch test.    Current Outpatient Prescriptions  Medication Sig Dispense Refill  . pravastatin (PRAVACHOL) 40 MG tablet Take 40 mg by mouth every evening.       . calcium carbonate (TUMS EX) 750 MG chewable tablet Chew 2 tablets by mouth daily.      . Cholecalciferol (D3-1000 PO) Take 1 capsule by mouth daily.      . Multiple Vitamin  (MULTIVITAMIN) tablet Take 1 tablet by mouth daily.      . Omega-3 Fatty Acids (FISH OIL) 1200 MG CAPS Take 1 capsule by mouth daily.       No current facility-administered medications for this visit.    OBJECTIVE: Middle-aged white woman who appears well Filed Vitals:   11/19/12 1458  BP: 85/54  Pulse: 63  Temp: 97.7 F (36.5 C)  Resp: 18     Body mass index is 21.92 kg/(m^2).    ECOG FS: 0 Filed Weights   11/19/12 1458  Weight: 131 lb 11.2 oz (59.739 kg)   Physical Exam: HEENT:  Sclerae anicteric. Oropharynx clear.   Nodes:  No cervical, supraclavicular, or axillary lymphadenopathy palpated.  Breast Exam:  Right breast is status post lumpectomy with well-healed incision. No evidence of local recurrence. Left breast unremarkable. Axillae are benign bilaterally with no adenopathy. Lungs:  Clear to auscultation bilaterally.  No crackles, rhonchi, or wheezes.   Heart:  Regular rate and rhythm.  No murmur appreciated Abdomen:  Soft, non tender to palpation.  Positive bowel sounds.  No organomegaly or masses palpated.   Musculoskeletal:  No focal spinal tenderness to palpation.  Extremities: No peripheral edema.   Neuro:  Nonfocal.Well oriented. Positive affect    LAB RESULTS: Lab Results  Component Value Date   WBC 4.6 11/19/2012   NEUTROABS 2.8 11/19/2012   HGB 12.9 11/19/2012   HCT 37.4 11/19/2012   MCV 91.5 11/19/2012   PLT 168 11/19/2012      Chemistry      Component Value Date/Time   NA 141 11/19/2012 1442   NA 140 10/08/2011 1132   K 4.3 11/19/2012 1442   K 4.3 10/08/2011 1132   CL 106 05/19/2012 1052   CL 107 10/08/2011 1132   CO2 22 11/19/2012 1442   CO2 28 10/08/2011 1132   BUN 20.3 11/19/2012 1442   BUN 20 10/08/2011 1132   CREATININE 0.9 11/19/2012 1442   CREATININE 0.92 10/08/2011 1132      Component Value Date/Time   CALCIUM 9.6 11/19/2012 1442   CALCIUM 10.1 10/08/2011 1132   ALKPHOS 64 11/19/2012 1442   ALKPHOS 59 10/08/2011 1132   AST 19 11/19/2012 1442   AST 20 10/08/2011  1132   ALT 23 11/19/2012 1442   ALT 18 10/08/2011 1132   BILITOT 1.18 11/19/2012 1442   BILITOT 1.5* 01/07/2012 1330       STUDIES:  Most recent mammogram on 11/25/2011 was unremarkable. This is scheduled to be repeated on may 2714.   ASSESSMENT: 72 y.o.  BRCA negative [place] woman of Ashkenazi Jewish ancestry  (1)  status post right lumpectomy and sentinel lymph node sampling 01/03/2011 for a T2 N0, stage IIA breast cancer with histiocytoid features, triple negative, grade 2, with an MIB-1 of 8%, the patient deciding to forgo chemotherapy  (2)  status post radiation completed mid December 2012  (3) status post bilateral salpingo-oophorectomy 03/12/2012 with benign pathology   PLAN: Vaida appears to be doing very well with regards to her breast cancer, and we'll continue to follow her every 6 months with labs and physical exam. She scheduled for her next mammogram next week on August 27. She'll see Dr. Darnelle Catalan in February of next year.  Ivylynn voices understanding and agreement with this plan. As always, she knows to call with any changes or problems.   Lenna Hagarty    11/19/2012

## 2012-11-25 ENCOUNTER — Ambulatory Visit
Admission: RE | Admit: 2012-11-25 | Discharge: 2012-11-25 | Disposition: A | Discharge status: Home / Self Care | Payer: Medicare Other | Source: Ambulatory Visit | Attending: Oncology | Admitting: Oncology

## 2012-11-25 DIAGNOSIS — Z853 Personal history of malignant neoplasm of breast: Secondary | ICD-10-CM | POA: Diagnosis not present

## 2012-11-25 DIAGNOSIS — R928 Other abnormal and inconclusive findings on diagnostic imaging of breast: Secondary | ICD-10-CM | POA: Diagnosis not present

## 2012-12-09 ENCOUNTER — Encounter (INDEPENDENT_AMBULATORY_CARE_PROVIDER_SITE_OTHER): Payer: Self-pay | Admitting: Surgery

## 2013-01-23 ENCOUNTER — Emergency Department (HOSPITAL_COMMUNITY): Payer: Medicare Other

## 2013-01-23 ENCOUNTER — Encounter (HOSPITAL_COMMUNITY): Payer: Self-pay | Admitting: Emergency Medicine

## 2013-01-23 ENCOUNTER — Emergency Department (HOSPITAL_COMMUNITY)
Admission: EM | Admit: 2013-01-23 | Discharge: 2013-01-23 | Disposition: A | Discharge status: Home / Self Care | Payer: Medicare Other | Attending: Emergency Medicine | Admitting: Emergency Medicine

## 2013-01-23 DIAGNOSIS — S0180XA Unspecified open wound of other part of head, initial encounter: Secondary | ICD-10-CM | POA: Insufficient documentation

## 2013-01-23 DIAGNOSIS — S62233A Other displaced fracture of base of first metacarpal bone, unspecified hand, initial encounter for closed fracture: Secondary | ICD-10-CM | POA: Insufficient documentation

## 2013-01-23 DIAGNOSIS — Y93K1 Activity, walking an animal: Secondary | ICD-10-CM | POA: Insufficient documentation

## 2013-01-23 DIAGNOSIS — Z87891 Personal history of nicotine dependence: Secondary | ICD-10-CM | POA: Insufficient documentation

## 2013-01-23 DIAGNOSIS — M25539 Pain in unspecified wrist: Secondary | ICD-10-CM | POA: Diagnosis not present

## 2013-01-23 DIAGNOSIS — F3289 Other specified depressive episodes: Secondary | ICD-10-CM | POA: Insufficient documentation

## 2013-01-23 DIAGNOSIS — Z880 Allergy status to penicillin: Secondary | ICD-10-CM | POA: Diagnosis not present

## 2013-01-23 DIAGNOSIS — F329 Major depressive disorder, single episode, unspecified: Secondary | ICD-10-CM | POA: Insufficient documentation

## 2013-01-23 DIAGNOSIS — S52599A Other fractures of lower end of unspecified radius, initial encounter for closed fracture: Secondary | ICD-10-CM | POA: Diagnosis not present

## 2013-01-23 DIAGNOSIS — E785 Hyperlipidemia, unspecified: Secondary | ICD-10-CM | POA: Diagnosis not present

## 2013-01-23 DIAGNOSIS — Z79899 Other long term (current) drug therapy: Secondary | ICD-10-CM | POA: Diagnosis not present

## 2013-01-23 DIAGNOSIS — S52501A Unspecified fracture of the lower end of right radius, initial encounter for closed fracture: Secondary | ICD-10-CM

## 2013-01-23 DIAGNOSIS — Y9289 Other specified places as the place of occurrence of the external cause: Secondary | ICD-10-CM | POA: Insufficient documentation

## 2013-01-23 DIAGNOSIS — W1809XA Striking against other object with subsequent fall, initial encounter: Secondary | ICD-10-CM | POA: Insufficient documentation

## 2013-01-23 DIAGNOSIS — S62202A Unspecified fracture of first metacarpal bone, left hand, initial encounter for closed fracture: Secondary | ICD-10-CM

## 2013-01-23 DIAGNOSIS — S59909A Unspecified injury of unspecified elbow, initial encounter: Secondary | ICD-10-CM | POA: Diagnosis not present

## 2013-01-23 DIAGNOSIS — S62309A Unspecified fracture of unspecified metacarpal bone, initial encounter for closed fracture: Secondary | ICD-10-CM | POA: Diagnosis not present

## 2013-01-23 MED ORDER — TETANUS-DIPHTH-ACELL PERTUSSIS 5-2.5-18.5 LF-MCG/0.5 IM SUSP
0.5000 mL | Freq: Once | INTRAMUSCULAR | Status: AC
  Administered 2013-01-23: 0.5 mL via INTRAMUSCULAR
  Filled 2013-01-23: qty 0.5

## 2013-01-23 MED ORDER — PROMETHAZINE HCL 25 MG PO TABS: 25.0000 mg | ORAL_TABLET | Freq: Four times a day (QID) | ORAL | Status: DC | PRN

## 2013-01-23 MED ORDER — HYDROMORPHONE HCL 2 MG PO TABS: 2.0000 mg | ORAL_TABLET | ORAL | Status: DC | PRN

## 2013-01-23 MED ORDER — ONDANSETRON 4 MG PO TBDP
4.0000 mg | ORAL_TABLET | Freq: Once | ORAL | Status: AC
  Administered 2013-01-23: 4 mg via ORAL
  Filled 2013-01-23: qty 1

## 2013-01-23 MED ORDER — HYDROMORPHONE HCL PF 2 MG/ML IJ SOLN
2.0000 mg | Freq: Once | INTRAMUSCULAR | Status: AC
  Administered 2013-01-23: 2 mg via INTRAMUSCULAR
  Filled 2013-01-23: qty 1

## 2013-01-23 NOTE — ED Notes (Signed)
She states she was pulled off-balance by a stray dog to which she had attached a leash; causing her to fall forward.  She has abrasions x 2 at right lat. Orbit area; and c/o pain at bilat wrists area.  She also has sm. Abrasions at wrist areas.  She denies l.o.c. And denies any neck pain.  She is alert and oriented x 4.

## 2013-01-23 NOTE — ED Provider Notes (Addendum)
CSN: [id number]     Arrival date & time 01/23/13  1853 History   First MD Initiated Contact with Patient 01/23/13 1907     Chief Complaint  Patient presents with  . Fall   (Consider location/radiation/quality/duration/timing/severity/associated sxs/prior Treatment) Patient is a 72 y.o. female presenting with fall. The history is provided by the patient.  Fall This is a new (walking a leashed dog that took off and pulled her over) problem. The current episode started less than 1 hour ago. The problem occurs constantly. The problem has not changed since onset.Associated symptoms comments: Bilateral wrist pain, deformity.  Fell on face with abrasions to the right forehead and laceration over the eyebrow.  Normal vision.  No LOC.  No headache or neck pain.. The symptoms are aggravated by bending and twisting. Nothing relieves the symptoms. She has tried nothing for the symptoms. The treatment provided no relief.    Past Medical History  Diagnosis Date  . Hyperlipidemia   . Depression     mild  . History of breast cancer DX 11-21-2011--  S/P LUMPECTOMY AND RADIATION -- FOLLOWED BY DR Arlice Colt  . History of hypotension   . Heart palpitations occasional -- avoids caffine  . Frequency of urination   . Nocturia    Past Surgical History  Procedure Laterality Date  . Breast lumpectomy w/ needle localization  01/03/2011    Right, w [facility] Dr Jamey Ripa  . Breast surgery  1962    removal benign left breast tumor   . Hysteroscopy w/ endometrial ablation  1980  . Laparoscopic bilateral salpingo oopherectomy  03/12/2012    Procedure: LAPAROSCOPIC BILATERAL SALPINGO OOPHORECTOMY;  Surgeon: Juluis Mire, MD;  Location: [facility] [place];  Service: Gynecology;  Laterality: Bilateral;  . Cystoscopy  03/12/2012    Procedure: CYSTOSCOPY;  Surgeon: Juluis Mire, MD;  Location: [facility];  Service: Gynecology;  Laterality: N/A;   Family History  Problem Relation Age of Onset  .  Cancer Mother     stomach  . Cancer Maternal Uncle     colon  . Cancer Sister     pancreatic   History  Substance Use Topics  . Smoking status: Former Smoker -- 1.00 packs/day for 20 years    Types: Cigarettes    Quit date: 03/11/1979  . Smokeless tobacco: Never Used  . Alcohol Use: No   OB History   Grav Para Term Preterm Abortions TAB SAB Ect Mult Living                 Review of Systems  Neurological: Negative for syncope, weakness and light-headedness.  All other systems reviewed and are negative.    Allergies  Contrast media and Penicillins  Home Medications   Current Outpatient Rx  Name  Route  Sig  Dispense  Refill  . calcium carbonate (TUMS EX) 750 MG chewable tablet   Oral   Chew 2 tablets by mouth daily.         . Cholecalciferol (D3-1000 PO)   Oral   Take 1 capsule by mouth daily.         . Multiple Vitamin (MULTIVITAMIN) tablet   Oral   Take 1 tablet by mouth daily.         . Omega-3 Fatty Acids (FISH OIL) 1200 MG CAPS   Oral   Take 1 capsule by mouth daily.         . pravastatin (PRAVACHOL) 40 MG tablet   Oral  Take 40 mg by mouth every evening.           BP 115/57  Pulse 70  Temp(Src) 98.7 F (37.1 C)  Resp 18  SpO2 97% Physical Exam  Nursing note and vitals reviewed. Constitutional: She is oriented to person, place, and time. She appears well-developed and well-nourished. No distress.  HENT:  Head: Normocephalic. Head is with abrasion, with contusion and with laceration.    Nose: Nose normal.  Mouth/Throat: Oropharynx is clear and moist.  1cm laceration above the right eyebrow  Eyes: Conjunctivae and EOM are normal. Pupils are equal, round, and reactive to light.  Neck: Normal range of motion. Neck supple. No spinous process tenderness and no muscular tenderness present.  Cardiovascular: Normal rate, regular rhythm and intact distal pulses.   No murmur heard. Pulmonary/Chest: Effort normal and breath sounds normal. No  respiratory distress. She has no wheezes. She has no rales.  Abdominal: Soft. She exhibits no distension. There is no tenderness. There is no rebound and no guarding.  Musculoskeletal: She exhibits tenderness. She exhibits no edema.       Right wrist: She exhibits tenderness, bony tenderness, swelling and deformity.       Left wrist: She exhibits decreased range of motion, tenderness, bony tenderness, swelling and deformity.  Pain, swelling and tenderness over bilateral radial wrist and swelling of the thenar eminence.  Severe pain withmovement of the thumbs and bilateral snuffbox tenderness.  Normal sensation in the fingers and <3sec cap refill with 2+ radial pulses bilaterally  Neurological: She is alert and oriented to person, place, and time.  Skin: Skin is warm and dry. No rash noted. No erythema.  Psychiatric: She has a normal mood and affect. Her behavior is normal.    ED Course  Procedures (including critical care time) Labs Review Labs Reviewed - No data to display Imaging Review Dg Wrist Complete Left  01/23/2013   CLINICAL DATA:  Left wrist pain after fall.  EXAM: LEFT WRIST - COMPLETE 3+ VIEW  COMPARISON:  None.  FINDINGS: Moderately angulated and comminuted fracture involving the proximal 1st metacarpal is noted. Joint spaces are otherwise intact. No soft tissue abnormality is noted.  IMPRESSION: Moderately angulated and comminuted fracture of the proximal 1st metacarpal.   Electronically Signed   By: Roque Lias M.D.   On: 01/23/2013 20:28   Dg Wrist Complete Right  01/23/2013   CLINICAL DATA:  Fall, bilateral wrist pain  EXAM: RIGHT WRIST - COMPLETE 3+ VIEW  COMPARISON:  None.  FINDINGS: Four views of the right wrist submitted. There is subtle nondisplaced fracture in distal right radius. This is best visualized on oblique view.  IMPRESSION: Subtle nondisplaced fracture distal right radius.   Electronically Signed   By: Natasha Mead M.D.   On: 01/23/2013 20:20   LACERATION  REPAIR Performed by: Gwyneth Sprout Authorized by: Gwyneth Sprout Consent: Verbal consent obtained. Risks and benefits: risks, benefits and alternatives were discussed Consent given by: patient Patient identity confirmed: provided demographic data Prepped and Draped in normal sterile fashion Wound explored  Laceration Location: right forehead  Laceration Length: 1cm  No Foreign Bodies seen or palpated  Anesthesia:none Irrigation method: skin scrub Amount of cleaning: standard  Skin closure: dermabond   Patient tolerance: Patient tolerated the procedure well with no immediate complications.  EKG Interpretation   None       MDM   1. Distal radius fracture, right, closed, initial encounter   2. First metacarpal bone fracture, left, closed, initial encounter  Patient with a mechanical ventilation and she was by a dog that was leashed. Patient has bilateral wrist pain and laceration to the right eyebrow. She denies any LOC, head injury, nausea or vomiting. She takes no anticoagulation at this time. Patient was able to ambulate without difficulty and his GCS of 15. He does have significant pain swelling and mild deformity of bilateral wrist and thenar eminence. She's neurovascularly intact with normal sensation and pulse in the wrist.  Tetanus updated. Wound dermabonded. Plain films of the wrist pending. Patient given pain control  8:43 PM Pt with bilateral fractures.  Left with comminuted, displaced 1st metacarpal fracture and right with non-displaced distal radius fracture.  Will place in bilateral splints.  Spoke with hand and pt will f/u on Monday.  She was given pain meds.  9:22 PM Dr. Janee Morn recommends surgery on tues.  Will place in thumb spica and volar splint.  When discussing with pt she feels that she may want to see Dr. Amanda Pea.  Will give her the number for both.  Gwyneth Sprout, MD 01/23/13 2123  Gwyneth Sprout, MD 01/23/13 2126

## 2013-01-23 NOTE — ED Notes (Signed)
Pt states she was trying to walk a pitbull that was wandering in her neighborhood, pt put a leash on the dog, states dog pulled her down and she hit her head on concrete steps. Denies LOC. Laceration to right eyebrow. Pt c/o pain to bilateral wrists and "all fingers".

## 2013-01-25 DIAGNOSIS — S62319A Displaced fracture of base of unspecified metacarpal bone, initial encounter for closed fracture: Secondary | ICD-10-CM | POA: Diagnosis not present

## 2013-01-25 DIAGNOSIS — S52599A Other fractures of lower end of unspecified radius, initial encounter for closed fracture: Secondary | ICD-10-CM | POA: Diagnosis not present

## 2013-01-26 DIAGNOSIS — S62309A Unspecified fracture of unspecified metacarpal bone, initial encounter for closed fracture: Secondary | ICD-10-CM | POA: Diagnosis not present

## 2013-01-26 DIAGNOSIS — Y998 Other external cause status: Secondary | ICD-10-CM | POA: Diagnosis not present

## 2013-01-26 DIAGNOSIS — Y929 Unspecified place or not applicable: Secondary | ICD-10-CM | POA: Diagnosis not present

## 2013-01-26 DIAGNOSIS — W010XXA Fall on same level from slipping, tripping and stumbling without subsequent striking against object, initial encounter: Secondary | ICD-10-CM | POA: Diagnosis not present

## 2013-01-26 DIAGNOSIS — W64XXXA Exposure to other animate mechanical forces, initial encounter: Secondary | ICD-10-CM | POA: Diagnosis not present

## 2013-01-26 DIAGNOSIS — S62233A Other displaced fracture of base of first metacarpal bone, unspecified hand, initial encounter for closed fracture: Secondary | ICD-10-CM | POA: Diagnosis not present

## 2013-01-26 DIAGNOSIS — Y9301 Activity, walking, marching and hiking: Secondary | ICD-10-CM | POA: Diagnosis not present

## 2013-01-31 DIAGNOSIS — J209 Acute bronchitis, unspecified: Secondary | ICD-10-CM | POA: Diagnosis not present

## 2013-02-08 DIAGNOSIS — S62309A Unspecified fracture of unspecified metacarpal bone, initial encounter for closed fracture: Secondary | ICD-10-CM | POA: Diagnosis not present

## 2013-02-11 ENCOUNTER — Ambulatory Visit (INDEPENDENT_AMBULATORY_CARE_PROVIDER_SITE_OTHER): Payer: Medicare Other | Admitting: Surgery

## 2013-02-12 ENCOUNTER — Other Ambulatory Visit: Payer: Self-pay | Admitting: Family Medicine

## 2013-02-12 DIAGNOSIS — F411 Generalized anxiety disorder: Secondary | ICD-10-CM | POA: Diagnosis not present

## 2013-02-12 DIAGNOSIS — R413 Other amnesia: Secondary | ICD-10-CM

## 2013-02-12 DIAGNOSIS — Z9181 History of falling: Secondary | ICD-10-CM

## 2013-02-12 DIAGNOSIS — E785 Hyperlipidemia, unspecified: Secondary | ICD-10-CM | POA: Diagnosis not present

## 2013-02-12 DIAGNOSIS — S0990XA Unspecified injury of head, initial encounter: Secondary | ICD-10-CM | POA: Diagnosis not present

## 2013-02-12 DIAGNOSIS — G478 Other sleep disorders: Secondary | ICD-10-CM | POA: Diagnosis not present

## 2013-02-12 DIAGNOSIS — Z0000 Encounter for general adult medical examination without abnormal findings: Secondary | ICD-10-CM | POA: Diagnosis not present

## 2013-02-12 DIAGNOSIS — D0590 Unspecified type of carcinoma in situ of unspecified breast: Secondary | ICD-10-CM | POA: Diagnosis not present

## 2013-02-15 ENCOUNTER — Other Ambulatory Visit: Payer: Self-pay | Admitting: Family Medicine

## 2013-02-15 ENCOUNTER — Other Ambulatory Visit: Payer: Medicare Other

## 2013-02-15 ENCOUNTER — Ambulatory Visit
Admission: RE | Admit: 2013-02-15 | Discharge: 2013-02-15 | Disposition: A | Discharge status: Home / Self Care | Payer: PRIVATE HEALTH INSURANCE | Source: Ambulatory Visit | Attending: Family Medicine | Admitting: Family Medicine

## 2013-02-15 DIAGNOSIS — W19XXXA Unspecified fall, initial encounter: Secondary | ICD-10-CM

## 2013-02-15 DIAGNOSIS — R413 Other amnesia: Secondary | ICD-10-CM

## 2013-02-15 DIAGNOSIS — S0990XA Unspecified injury of head, initial encounter: Secondary | ICD-10-CM | POA: Diagnosis not present

## 2013-03-12 ENCOUNTER — Ambulatory Visit (INDEPENDENT_AMBULATORY_CARE_PROVIDER_SITE_OTHER): Payer: Medicare Other | Admitting: Surgery

## 2013-03-15 DIAGNOSIS — S52599A Other fractures of lower end of unspecified radius, initial encounter for closed fracture: Secondary | ICD-10-CM | POA: Diagnosis not present

## 2013-03-15 DIAGNOSIS — S62309A Unspecified fracture of unspecified metacarpal bone, initial encounter for closed fracture: Secondary | ICD-10-CM | POA: Diagnosis not present

## 2013-03-16 DIAGNOSIS — R413 Other amnesia: Secondary | ICD-10-CM | POA: Diagnosis not present

## 2013-03-16 DIAGNOSIS — Z23 Encounter for immunization: Secondary | ICD-10-CM | POA: Diagnosis not present

## 2013-03-16 DIAGNOSIS — S62309A Unspecified fracture of unspecified metacarpal bone, initial encounter for closed fracture: Secondary | ICD-10-CM | POA: Diagnosis not present

## 2013-03-16 DIAGNOSIS — S52599A Other fractures of lower end of unspecified radius, initial encounter for closed fracture: Secondary | ICD-10-CM | POA: Diagnosis not present

## 2013-03-23 DIAGNOSIS — S62309A Unspecified fracture of unspecified metacarpal bone, initial encounter for closed fracture: Secondary | ICD-10-CM | POA: Diagnosis not present

## 2013-03-23 DIAGNOSIS — S52599A Other fractures of lower end of unspecified radius, initial encounter for closed fracture: Secondary | ICD-10-CM | POA: Diagnosis not present

## 2013-03-29 ENCOUNTER — Ambulatory Visit (INDEPENDENT_AMBULATORY_CARE_PROVIDER_SITE_OTHER): Payer: Medicare Other | Admitting: Surgery

## 2013-03-29 ENCOUNTER — Encounter (INDEPENDENT_AMBULATORY_CARE_PROVIDER_SITE_OTHER): Payer: Self-pay | Admitting: Surgery

## 2013-03-29 VITALS — BP 98/58 | HR 60 | Temp 98.4°F | Resp 14 | Ht 65.0 in | Wt 130.2 lb

## 2013-03-29 DIAGNOSIS — Z853 Personal history of malignant neoplasm of breast: Secondary | ICD-10-CM | POA: Diagnosis not present

## 2013-03-29 NOTE — Patient Instructions (Signed)
Continue annual mammograms and followups here 

## 2013-03-29 NOTE — Progress Notes (Signed)
NAME: [name]       DOB: March 19, 1941           DATE: 03/29/2013       MRN: [id number]   [name] is a 72 y.o.Marland Kitchenfemale who presents for routine followup of her Triple negative, stage IIA right breast cancer diagnosed in 2012 and treated with lumpectomy and radiation. She has no problems or concerns on either side.  PFSH: She has had no significant changes since the last visit here.  ROS: There have been no significant changes since the last visit here  EXAM:  VS: BP 98/58  Pulse 60  Temp(Src) 98.4 F (36.9 C) (Temporal)  Resp 14  Ht 5\' 5"  (1.651 m)  Wt 130 lb 3.2 oz (59.058 kg)  BMI 21.67 kg/m2  General: The patient is alert, oriented, generally healthy appearing, NAD. Mood and affect are normal.  Breasts:  Tender right breast, lumpectomy site soft, no evidence of recurrence. Left is normal  Lymphatics: She has no axillary or supraclavicular adenopathy on either side.  Extremities: Full ROM of the surgical side with no lymphedema noted.  Data Reviewed: Mammogram August, 2014: DIGITAL DIAGNOSTIC BILATERAL MAMMOGRAM WITH CAD  DIGITAL BREAST TOMOSYNTHESIS  Digital breast tomosynthesis images are acquired in two  projections. These images are reviewed in combination with the  digital mammogram, confirming the findings below.  Comparison: November 25, 2011, November 21, 2010  Findings:  ACR Breast Density Category heterogeneously dense  CC and MLO views of bilateral breasts, spot tangential view of the  right breast are submitted. Stable postsurgical changes are  identified within the right breast. No suspicious abnormality is  identified in the left breast.  Mammographic images were processed with CAD.  IMPRESSION:  Benign findings.  RECOMMENDATION:  Bilateral diagnostic mammogram 1 year.  I have discussed the findings and recommendations with the patient.  Results were also provided in writing at the conclusion of the  visit. If applicable, a reminder letter  will be sent to the  patient regarding her next appointment.  BI-RADS CATEGORY 2: Benign finding(s).  Original Report Authenticated By: Sherian Rein, M.D.   Impression: Doing well, with no evidence of recurrent cancer or new cancer  Plan: Will continue to follow up on an annual basis here

## 2013-04-05 DIAGNOSIS — S52599A Other fractures of lower end of unspecified radius, initial encounter for closed fracture: Secondary | ICD-10-CM | POA: Diagnosis not present

## 2013-04-05 DIAGNOSIS — S62309A Unspecified fracture of unspecified metacarpal bone, initial encounter for closed fracture: Secondary | ICD-10-CM | POA: Diagnosis not present

## 2013-04-09 DIAGNOSIS — S52599A Other fractures of lower end of unspecified radius, initial encounter for closed fracture: Secondary | ICD-10-CM | POA: Diagnosis not present

## 2013-04-27 DIAGNOSIS — H40019 Open angle with borderline findings, low risk, unspecified eye: Secondary | ICD-10-CM | POA: Diagnosis not present

## 2013-04-27 DIAGNOSIS — H04129 Dry eye syndrome of unspecified lacrimal gland: Secondary | ICD-10-CM | POA: Diagnosis not present

## 2013-04-27 DIAGNOSIS — H2510 Age-related nuclear cataract, unspecified eye: Secondary | ICD-10-CM | POA: Diagnosis not present

## 2013-04-27 DIAGNOSIS — F909 Attention-deficit hyperactivity disorder, unspecified type: Secondary | ICD-10-CM | POA: Diagnosis not present

## 2013-04-30 ENCOUNTER — Telehealth: Payer: Self-pay | Admitting: *Deleted

## 2013-04-30 NOTE — Telephone Encounter (Signed)
Lm informing the pt that GCM will be on call during the pm on 2.23.15. gv appt for 2.23.15@ 9:30am. Made pt aware that i will mail a letter/avs...td

## 2013-05-11 DIAGNOSIS — D239 Other benign neoplasm of skin, unspecified: Secondary | ICD-10-CM | POA: Diagnosis not present

## 2013-05-11 DIAGNOSIS — L821 Other seborrheic keratosis: Secondary | ICD-10-CM | POA: Diagnosis not present

## 2013-05-17 ENCOUNTER — Other Ambulatory Visit: Payer: Medicare Other

## 2013-05-17 ENCOUNTER — Other Ambulatory Visit (HOSPITAL_BASED_OUTPATIENT_CLINIC_OR_DEPARTMENT_OTHER): Payer: Medicare Other

## 2013-05-17 DIAGNOSIS — C50211 Malignant neoplasm of upper-inner quadrant of right female breast: Secondary | ICD-10-CM

## 2013-05-17 DIAGNOSIS — C50219 Malignant neoplasm of upper-inner quadrant of unspecified female breast: Secondary | ICD-10-CM

## 2013-05-17 LAB — COMPREHENSIVE METABOLIC PANEL (CC13)
ALBUMIN: 3.7 g/dL (ref 3.5–5.0)
ALT: 15 U/L (ref 0–55)
AST: 17 U/L (ref 5–34)
Alkaline Phosphatase: 64 U/L (ref 40–150)
Anion Gap: 9 mEq/L (ref 3–11)
BILIRUBIN TOTAL: 1.03 mg/dL (ref 0.20–1.20)
BUN: 13.5 mg/dL (ref 7.0–26.0)
CO2: 24 mEq/L (ref 22–29)
Calcium: 9.9 mg/dL (ref 8.4–10.4)
Chloride: 110 mEq/L — ABNORMAL HIGH (ref 98–109)
Creatinine: 0.8 mg/dL (ref 0.6–1.1)
GLUCOSE: 120 mg/dL (ref 70–140)
Potassium: 4.1 mEq/L (ref 3.5–5.1)
Sodium: 143 mEq/L (ref 136–145)
Total Protein: 6.5 g/dL (ref 6.4–8.3)

## 2013-05-17 LAB — CBC WITH DIFFERENTIAL/PLATELET
BASO%: 0.5 % (ref 0.0–2.0)
Basophils Absolute: 0 10*3/uL (ref 0.0–0.1)
EOS%: 1.9 % (ref 0.0–7.0)
Eosinophils Absolute: 0.1 10*3/uL (ref 0.0–0.5)
HEMATOCRIT: 39.1 % (ref 34.8–46.6)
HGB: 13.2 g/dL (ref 11.6–15.9)
LYMPH#: 1.3 10*3/uL (ref 0.9–3.3)
LYMPH%: 29.1 % (ref 14.0–49.7)
MCH: 31.1 pg (ref 25.1–34.0)
MCHC: 33.8 g/dL (ref 31.5–36.0)
MCV: 91.9 fL (ref 79.5–101.0)
MONO#: 0.3 10*3/uL (ref 0.1–0.9)
MONO%: 6.8 % (ref 0.0–14.0)
NEUT%: 61.7 % (ref 38.4–76.8)
NEUTROS ABS: 2.8 10*3/uL (ref 1.5–6.5)
PLATELETS: 168 10*3/uL (ref 145–400)
RBC: 4.25 10*6/uL (ref 3.70–5.45)
RDW: 13.5 % (ref 11.2–14.5)
WBC: 4.6 10*3/uL (ref 3.9–10.3)

## 2013-05-19 DIAGNOSIS — F909 Attention-deficit hyperactivity disorder, unspecified type: Secondary | ICD-10-CM | POA: Diagnosis not present

## 2013-05-24 ENCOUNTER — Ambulatory Visit (HOSPITAL_BASED_OUTPATIENT_CLINIC_OR_DEPARTMENT_OTHER): Payer: Medicare Other | Admitting: Oncology

## 2013-05-24 ENCOUNTER — Telehealth: Payer: Self-pay | Admitting: *Deleted

## 2013-05-24 VITALS — BP 94/59 | HR 64 | Temp 97.5°F | Resp 18 | Ht 65.0 in | Wt 133.9 lb

## 2013-05-24 DIAGNOSIS — Z853 Personal history of malignant neoplasm of breast: Secondary | ICD-10-CM | POA: Diagnosis not present

## 2013-05-24 DIAGNOSIS — Z1502 Genetic susceptibility to malignant neoplasm of ovary: Secondary | ICD-10-CM

## 2013-05-24 DIAGNOSIS — Z90721 Acquired absence of ovaries, unilateral: Secondary | ICD-10-CM

## 2013-05-24 DIAGNOSIS — C50219 Malignant neoplasm of upper-inner quadrant of unspecified female breast: Secondary | ICD-10-CM

## 2013-05-24 NOTE — Progress Notes (Signed)
ID: [name]   DOB: [birth year]  MR#: [id number]  FAO#:[id number]  PCP: Vena Austria, MD GYN: Arvella Nigh SU: Osborn Coho OTHER MD: Lavonna Monarch  HISTORY OF PRESENT ILLNESS: Cathaleen had neglected her health maintenance for the last several years, partly because she has been so concerned about her sister who died from pancreatic cancer this past March. At any rate, recently she was putting on her bra and noticed a change in the lateral aspect of her right breast. She brought it to Dr. Noland Fordyce attention, and she was set up for bilateral diagnostic mammography at the breast center November 21, 2010. This found her breasts to be heterogeneously dense, and in the upper outer quadrant there was an irregular mass measuring approximately 2.7 cm.   On physical exam, Dr. Karlton Lemon was able to palpate a soft mass measuring approximately 2 cm which on ultrasound had irregular borders and was hypoechoic. It measured 1.7 cm sonographically. Evaluation of the right axilla was negative.   On the same day, the patient proceeded to ultrasound-guided core biopsy, and the pathology from this procedure ([id number]) showed an invasive mammary carcinoma with histiocytoid features, triple negative, with a proliferation marker by MIB-1 of 8%.   Ms. Kernodle case was discussed at the multidisciplinary breast cancer conference on September 26th. The cells were certainly unusual looking with a significant amount of cytoplasmic vacuolation. The fact that she has one of the so called "low-grade triple negative tumors" led to much discussion, and the feeling was that chemotherapy was not automatic in this case but certainly needed to be considered.    The patient underwent right lumpectomy and sentinel lymph node sampling on 01/03/2011 for a T2N0, stage II breast carcinoma.  She decided to forgo chemotherapy. She received radiation therapy, completed mid December of 2012. Her subsequent history is as detailed  below.  INTERVAL HISTORY: Lexey returns today for followup of her right breast carcinoma.  Interval history is significant for having had a terrific fall breaking both wrists with head concussion. She picked up a stray dogs, leash bed, and tried to take MR on the neighborhood and a dog bolded, dragging her down concrete steps. This was in October. She was in a cast for both arms for 7 weeks, which was very uncomfortable off course. She is now doing much better, still doing a little rehabilitation on her own.  REVIEW OF SYSTEMS: Aside from problems relating to the October trauma, a detailed review of systems today was entirely negative.  PAST MEDICAL HISTORY: Past Medical History  Diagnosis Date  . Hyperlipidemia   . Depression     mild  . History of breast cancer DX 11-21-2011--  S/P LUMPECTOMY AND RADIATION -- FOLLOWED BY DR Griffith Citron  . History of hypotension   . Heart palpitations occasional -- avoids caffine  . Frequency of urination   . Nocturia   . Breast cancer, IDC, Right, UIQ, Stage II, triple negative 11/21/2010    Invasive ductal carcinoma with histiocytic features.Lumpectomy &SLN on 01/03/11. Grade II tumor, 2.3 cm, ER 0%, PR 0%, Her 2 -, Ki76 8%. One SLN negative. Declined chemo.Radiatiion:TREATMENT DATES: 02/13/2011 through 03/13/2011  SITE/DOSE: Right breast 4250 cGy 17 sessions. Right breast boost 750 cGy 3 sessions  BEAMS/ENERGY: 6 MV photons tangential fields to the right breast. 15 MEV electrons, rig    PAST SURGICAL HISTORY: Past Surgical History  Procedure Laterality Date  . Breast lumpectomy w/ needle localization  01/03/2011    Right, w SLN Dr Margot Chimes  .  Breast surgery  1962    removal benign left breast tumor   . Hysteroscopy w/ endometrial ablation  1980  . Laparoscopic bilateral salpingo oopherectomy  03/12/2012    Procedure: LAPAROSCOPIC BILATERAL SALPINGO OOPHORECTOMY;  Surgeon: Darlyn Chamber, MD;  Location: [facility];  Service: Gynecology;   Laterality: Bilateral;  . Cystoscopy  03/12/2012    Procedure: CYSTOSCOPY;  Surgeon: Darlyn Chamber, MD;  Location: [facility];  Service: Gynecology;  Laterality: N/A;    FAMILY HISTORY Family History  Problem Relation Age of Onset  . Cancer Mother     stomach  . Cancer Maternal Uncle     colon  . Cancer Sister     pancreatic  There is no history of breast cancer in the family. There is no history of ovarian cancer in the family to her knowledge.   GYNECOLOGIC HISTORY: Menarche at age 73, last period in 20. She is GX, P4, first live birth at age 73. She never used hormones or birth control pills. 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 Her daughter, Lehman Prom, lives in [place], [place]. 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 Daughter, Sharlett Lienemann, lives in [place], and she has a Brewing technologist in social work working primarily with prisoners. Son Laban Emperor Wehrenberg,  lives in [place]. He is a Administrator. Son Malisha Mabey, also lives in [place] and works at [employer] in [place]. The patient attends [employer]. She has 6 grandchildren.      ADVANCED DIRECTIVES:  HEALTH MAINTENANCE: History  Substance Use Topics  . Smoking status: Former Smoker -- 1.00 packs/day for 20 years    Types: Cigarettes    Quit date: 03/11/1979  . Smokeless tobacco: Never Used  . Alcohol Use: No     Colonoscopy: 2011, [facility]  PAP: approx 2010  Bone density: 2013, osteoporosis  Lipid panel:   Allergies  Allergen Reactions  . Contrast Media [Iodinated Diagnostic Agents] Anaphylaxis    AREA INFLAMMED AND SWELLING W/ BREAST NEEDLE LOCALIZATION  . Penicillins Other (See Comments)    Patient does  not know of reaction. Was based on a scratch test.    Current Outpatient Prescriptions  Medication Sig Dispense Refill  . pravastatin (PRAVACHOL) 40 MG tablet Take 40 mg by mouth every evening.        No current facility-administered medications for this visit.    OBJECTIVE: Middle-aged white woman in no acute distress Filed Vitals:   05/24/13 0932  BP: 94/59  Pulse: 64  Temp: 97.5 F (36.4 C)  Resp: 18     Body mass index is 22.28 kg/(m^2).    ECOG FS: 0 Filed Weights   05/24/13 0932  Weight: 133 lb 14.4 oz (60.737 kg)   Sclerae unicteric, pupils equal and reactive Oropharynx clear and moist No cervical or supraclavicular adenopathy Lungs no rales or rhonchi Heart regular rate and rhythm Abd soft, nontender, positive bowel sounds MSK no focal spinal tenderness, no upper extremity lymphedema; slightly limited great on the left, no limitations on the right Neuro: nonfocal, well oriented, appropriate affect Breasts: Status post right lumpectomy and radiation. There is no evidence of local recurrence. The right axilla is benign. The left breast is  unremarkable.  t    LAB RESULTS: Lab Results  Component Value Date   WBC 4.6 05/17/2013   NEUTROABS 2.8 05/17/2013   HGB 13.2 05/17/2013   HCT 39.1 05/17/2013   MCV 91.9 05/17/2013   PLT 168 05/17/2013      Chemistry      Component Value Date/Time   NA 143 05/17/2013 1225   NA 140 10/08/2011 1132   K 4.1 05/17/2013 1225   K 4.3 10/08/2011 1132   CL 106 05/19/2012 1052   CL 107 10/08/2011 1132   CO2 24 05/17/2013 1225   CO2 28 10/08/2011 1132   BUN 13.5 05/17/2013 1225   BUN 20 10/08/2011 1132   CREATININE 0.8 05/17/2013 1225   CREATININE 0.92 10/08/2011 1132      Component Value Date/Time   CALCIUM 9.9 05/17/2013 1225   CALCIUM 10.1 10/08/2011 1132   ALKPHOS 64 05/17/2013 1225   ALKPHOS 59 10/08/2011 1132   AST 17 05/17/2013 1225   AST 20 10/08/2011 1132   ALT 15 05/17/2013 1225   ALT 18 10/08/2011 1132   BILITOT 1.03 05/17/2013 1225    BILITOT 1.5* 01/07/2012 1330       STUDIES:  Clinical Data: Personal history of right breast cancer status post  lumpectomy 2012  DIGITAL DIAGNOSTIC BILATERAL MAMMOGRAM WITH CAD 11/25/2012 DIGITAL BREAST TOMOSYNTHESIS  Digital breast tomosynthesis images are acquired in two  projections. These images are reviewed in combination with the  digital mammogram, confirming the findings below.  Comparison: November 25, 2011, November 21, 2010  Findings:  ACR Breast Density Category heterogeneously dense  CC and MLO views of bilateral breasts, spot tangential view of the  right breast are submitted. Stable postsurgical changes are  identified within the right breast. No suspicious abnormality is  identified in the left breast.  Mammographic images were processed with CAD.  IMPRESSION:  Benign findings.    ASSESSMENT: 74 y.o.  BRCA negative [place] woman of Ashkenazi Jewish ancestry  (1)  status post right lumpectomy and sentinel lymph node sampling 01/03/2011 for a T2 N0, stage IIA breast cancer with histiocytoid features, triple negative, grade 2, with an MIB-1 of 8%, the patient deciding to forgo chemotherapy  (2)  status post radiation completed mid December 2012  (3) status post bilateral salpingo-oophorectomy 03/12/2012 with benign pathology   PLAN: Jonna is doing fine as far as her breast cancer is concerned. She is now through the first 2 years, which are the most dangerous 1 for a triple-negative disease. She is concerned there may be a genetic problem in her family, since her sister had pancreatic cancer and her brother has a former prostate cancer. However she was BRCA negative and she has had her ovaries removed.  We're going to see her again in September of this year, after her next mammogram. At that point we will start seeing her on a once a year basis until she completes her 5 years of followup. She knows to call for any problems that may develop before her next visit  here. MAGRINAT,GUSTAV C    05/24/2013

## 2013-05-24 NOTE — Telephone Encounter (Signed)
appts made and printed...td 

## 2013-07-19 NOTE — Telephone Encounter (Signed)
Please see Visit Info comments 

## 2013-08-31 DIAGNOSIS — H40019 Open angle with borderline findings, low risk, unspecified eye: Secondary | ICD-10-CM | POA: Diagnosis not present

## 2013-12-13 ENCOUNTER — Other Ambulatory Visit (HOSPITAL_BASED_OUTPATIENT_CLINIC_OR_DEPARTMENT_OTHER): Payer: Medicare Other

## 2013-12-13 DIAGNOSIS — Z1502 Genetic susceptibility to malignant neoplasm of ovary: Secondary | ICD-10-CM

## 2013-12-13 DIAGNOSIS — Z90721 Acquired absence of ovaries, unilateral: Secondary | ICD-10-CM

## 2013-12-13 DIAGNOSIS — Z853 Personal history of malignant neoplasm of breast: Secondary | ICD-10-CM

## 2013-12-13 LAB — CBC WITH DIFFERENTIAL/PLATELET
BASO%: 0.5 % (ref 0.0–2.0)
Basophils Absolute: 0 10*3/uL (ref 0.0–0.1)
EOS%: 1.9 % (ref 0.0–7.0)
Eosinophils Absolute: 0.1 10*3/uL (ref 0.0–0.5)
HEMATOCRIT: 40.1 % (ref 34.8–46.6)
HGB: 13.7 g/dL (ref 11.6–15.9)
LYMPH#: 1.5 10*3/uL (ref 0.9–3.3)
LYMPH%: 35.6 % (ref 14.0–49.7)
MCH: 30.9 pg (ref 25.1–34.0)
MCHC: 34.2 g/dL (ref 31.5–36.0)
MCV: 90.3 fL (ref 79.5–101.0)
MONO#: 0.4 10*3/uL (ref 0.1–0.9)
MONO%: 8.3 % (ref 0.0–14.0)
NEUT#: 2.3 10*3/uL (ref 1.5–6.5)
NEUT%: 53.7 % (ref 38.4–76.8)
Platelets: 173 10*3/uL (ref 145–400)
RBC: 4.44 10*6/uL (ref 3.70–5.45)
RDW: 12.6 % (ref 11.2–14.5)
WBC: 4.2 10*3/uL (ref 3.9–10.3)

## 2013-12-13 LAB — COMPREHENSIVE METABOLIC PANEL (CC13)
ALT: 10 U/L (ref 0–55)
ANION GAP: 8 meq/L (ref 3–11)
AST: 14 U/L (ref 5–34)
Albumin: 3.8 g/dL (ref 3.5–5.0)
Alkaline Phosphatase: 61 U/L (ref 40–150)
BUN: 12.7 mg/dL (ref 7.0–26.0)
CHLORIDE: 111 meq/L — AB (ref 98–109)
CO2: 23 mEq/L (ref 22–29)
Calcium: 9.5 mg/dL (ref 8.4–10.4)
Creatinine: 0.9 mg/dL (ref 0.6–1.1)
Glucose: 80 mg/dl (ref 70–140)
Potassium: 4.3 mEq/L (ref 3.5–5.1)
SODIUM: 142 meq/L (ref 136–145)
TOTAL PROTEIN: 7.2 g/dL (ref 6.4–8.3)
Total Bilirubin: 1.18 mg/dL (ref 0.20–1.20)

## 2013-12-16 ENCOUNTER — Other Ambulatory Visit: Payer: Self-pay | Admitting: Oncology

## 2013-12-16 DIAGNOSIS — Z853 Personal history of malignant neoplasm of breast: Secondary | ICD-10-CM

## 2013-12-20 ENCOUNTER — Telehealth: Payer: Self-pay | Admitting: Oncology

## 2013-12-20 ENCOUNTER — Ambulatory Visit (HOSPITAL_BASED_OUTPATIENT_CLINIC_OR_DEPARTMENT_OTHER): Payer: Medicare Other | Admitting: Oncology

## 2013-12-20 VITALS — BP 106/46 | HR 60 | Temp 97.5°F | Resp 18 | Ht 65.0 in | Wt 133.1 lb

## 2013-12-20 DIAGNOSIS — Z800 Family history of malignant neoplasm of digestive organs: Secondary | ICD-10-CM | POA: Diagnosis not present

## 2013-12-20 DIAGNOSIS — Z853 Personal history of malignant neoplasm of breast: Secondary | ICD-10-CM

## 2013-12-20 DIAGNOSIS — C50219 Malignant neoplasm of upper-inner quadrant of unspecified female breast: Secondary | ICD-10-CM

## 2013-12-20 DIAGNOSIS — Z1502 Genetic susceptibility to malignant neoplasm of ovary: Secondary | ICD-10-CM

## 2013-12-20 NOTE — Progress Notes (Signed)
ID: Docia Barrier Harbeson   DOB: Jun 01, 1940  MR#: [id number]  AOZ#:[id number]  PCP: Vena Austria, MD GYN: Arvella Nigh SU: Osborn Coho OTHER MD: Lavonna Monarch  HISTORY OF PRESENT ILLNESS: Jen had neglected her health maintenance for the last several years, partly because she has been so concerned about her sister who died from pancreatic cancer this past March. At any rate, recently she was putting on her bra and noticed a change in the lateral aspect of her right breast. She brought it to Dr. Noland Fordyce attention, and she was set up for bilateral diagnostic mammography at the breast center November 21, 2010. This found her breasts to be heterogeneously dense, and in the upper outer quadrant there was an irregular mass measuring approximately 2.7 cm.   On physical exam, Dr. Karlton Lemon was able to palpate a soft mass measuring approximately 2 cm which on ultrasound had irregular borders and was hypoechoic. It measured 1.7 cm sonographically. Evaluation of the right axilla was negative.   On the same day, the patient proceeded to ultrasound-guided core biopsy, and the pathology from this procedure ([id number]) showed an invasive mammary carcinoma with histiocytoid features, triple negative, with a proliferation marker by MIB-1 of 8%.   Ms. Burger case was discussed at the multidisciplinary breast cancer conference on September 26th. The cells were certainly unusual looking with a significant amount of cytoplasmic vacuolation. The fact that she has one of the so called "low-grade triple negative tumors" led to much discussion, and the feeling was that chemotherapy was not automatic in this case but certainly needed to be considered.    The patient underwent right lumpectomy and sentinel lymph node sampling on 01/03/2011 for a T2N0, stage II breast carcinoma.  She decided to forgo chemotherapy. She received radiation therapy, completed mid December of 2012. Her subsequent history is as detailed  below.  INTERVAL HISTORY: Lumen returns today for followup of her right breast carcinoma accompanied by her friend Kennyth Lose. The interval history is unremarkable. She is not exercising much, although occasionally she takes a short walk to the shopping center nearby.  REVIEW OF SYSTEMS: She recovered well from her fall, has good grip bilaterally, although she still has some soreness in both wrists at times. She has stress urinary incontinence which is not any different from before. Otherwise a detailed review of systems today was noncontributory  PAST MEDICAL HISTORY: Past Medical History  Diagnosis Date  . Hyperlipidemia   . Depression     mild  . History of breast cancer DX 11-21-2011--  S/P LUMPECTOMY AND RADIATION -- FOLLOWED BY DR Griffith Citron  . History of hypotension   . Heart palpitations occasional -- avoids caffine  . Frequency of urination   . Nocturia   . Breast cancer, IDC, Right, UIQ, Stage II, triple negative 11/21/2010    Invasive ductal carcinoma with histiocytic features.Lumpectomy &SLN on 01/03/11. Grade II tumor, 2.3 cm, ER 0%, PR 0%, Her 2 -, Ki76 8%. One SLN negative. Declined chemo.Radiatiion:TREATMENT DATES: 02/13/2011 through 03/13/2011  SITE/DOSE: Right breast 4250 cGy 17 sessions. Right breast boost 750 cGy 3 sessions  BEAMS/ENERGY: 6 MV photons tangential fields to the right breast. 15 MEV electrons, rig    PAST SURGICAL HISTORY: Past Surgical History  Procedure Laterality Date  . Breast lumpectomy w/ needle localization  01/03/2011    Right, w SLN Dr Margot Chimes  . Breast surgery  1962    removal benign left breast tumor   . Hysteroscopy w/ endometrial ablation  1980  . Laparoscopic  bilateral salpingo oopherectomy  03/12/2012    Procedure: LAPAROSCOPIC BILATERAL SALPINGO OOPHORECTOMY;  Surgeon: Darlyn Chamber, MD;  Location: [facility];  Service: Gynecology;  Laterality: Bilateral;  . Cystoscopy  03/12/2012    Procedure: CYSTOSCOPY;  Surgeon: Darlyn Chamber, MD;  Location: [facility];  Service: Gynecology;  Laterality: N/A;    FAMILY HISTORY Family History  Problem Relation Age of Onset  . Cancer Mother     stomach  . Cancer Maternal Uncle     colon  . Cancer Sister     pancreatic  There is no history of breast cancer in the family. There is no history of ovarian cancer in the family to her knowledge.   GYNECOLOGIC HISTORY: Menarche at age 73, last period in 23. She is GX, P4, first live birth at age 73. She never used hormones or birth control pills. 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 Her daughter, Lehman Prom, lives in [place], [place]. 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 Daughter, Salli Bodin, lives in [place], and she has a Brewing technologist in social work working primarily with prisoners. Son Laban Emperor Mallick,  lives in [place]. He is a Administrator. Son Shakila Mak, also lives in [place] and works at [employer] in [place]. The patient attends [employer]. She has 6 grandchildren.      ADVANCED DIRECTIVES:  HEALTH MAINTENANCE: History  Substance Use Topics  . Smoking status: Former Smoker -- 1.00 packs/day for 20 years    Types: Cigarettes    Quit date: 03/11/1979  . Smokeless tobacco: Never Used  . Alcohol Use: No     Colonoscopy: 2011, [facility]  PAP: approx 2010  Bone density: 2013, osteoporosis  Lipid panel:   Allergies  Allergen Reactions  . Contrast Media [Iodinated Diagnostic Agents] Anaphylaxis    AREA INFLAMMED AND SWELLING W/ BREAST NEEDLE LOCALIZATION  . Penicillins Other (See Comments)    Patient does not know of reaction. Was based on a scratch test.    Current Outpatient Prescriptions   Medication Sig Dispense Refill  . pravastatin (PRAVACHOL) 40 MG tablet Take 40 mg by mouth every evening.        No current facility-administered medications for this visit.    OBJECTIVE: Middle-aged white woman in no acute distress Filed Vitals:   12/20/13 1416  BP: 106/46  Pulse: 60  Temp: 97.5 F (36.4 C)  Resp: 18     Body mass index is 22.15 kg/(m^2).    ECOG FS: 1 Filed Weights   12/20/13 1416  Weight: 133 lb 1.6 oz (60.374 kg)   Sclerae unicteric, EOMs intact Oropharynx clear, teeth in good condition No cervical or supraclavicular adenopathy Lungs no rales or rhonchi Heart regular rate and rhythm Abd soft, nontender, positive bowel sounds MSK no focal spinal tenderness, no upper extremity lymphedema, and no wrist edema Neuro: nonfocal, well oriented, positive affect Breasts: The right breast status post lumpectomy and radiation. There is no evidence of local recurrence. The right axilla is benign. The left breast is unremarkable    LAB RESULTS: Lab Results  Component Value Date   WBC 4.2 12/13/2013   NEUTROABS 2.3 12/13/2013   HGB 13.7 12/13/2013  HCT 40.1 12/13/2013   MCV 90.3 12/13/2013   PLT 173 12/13/2013      Chemistry      Component Value Date/Time   NA 142 12/13/2013 1419   NA 140 10/08/2011 1132   K 4.3 12/13/2013 1419   K 4.3 10/08/2011 1132   CL 106 05/19/2012 1052   CL 107 10/08/2011 1132   CO2 23 12/13/2013 1419   CO2 28 10/08/2011 1132   BUN 12.7 12/13/2013 1419   BUN 20 10/08/2011 1132   CREATININE 0.9 12/13/2013 1419   CREATININE 0.92 10/08/2011 1132      Component Value Date/Time   CALCIUM 9.5 12/13/2013 1419   CALCIUM 10.1 10/08/2011 1132   ALKPHOS 61 12/13/2013 1419   ALKPHOS 59 10/08/2011 1132   AST 14 12/13/2013 1419   AST 20 10/08/2011 1132   ALT 10 12/13/2013 1419   ALT 18 10/08/2011 1132   BILITOT 1.18 12/13/2013 1419   BILITOT 1.5* 01/07/2012 1330       STUDIES:  repeat mammogram scheduled for 2 days from now  ASSESSMENT: 73 y.o.  BRCA  negative [place] woman of Ashkenazi Jewish ancestry  (1)  status post right lumpectomy and sentinel lymph node sampling 01/03/2011 for a T2 N0, stage IIA breast cancer with histiocytoid features, triple negative, grade 2, with an MIB-1 of 8%, the patient deciding to forgo chemotherapy  (2)  status post radiation completed mid December 2012  (3) status post bilateral salpingo-oophorectomy 03/12/2012 with benign pathology   PLAN: Heloise is now just about 3 years out from her definitive surgery. There is no evidence of disease recurrence. This is very favorable. I am going to start seeing her on a once a year basis until she completes her 5 years of followup.  I have strongly encouraged her to start an exercise program. Her friend Kennyth Lose goes walking everyday and she certainly could join [facility]. Otherwise I gave her and [employer] pamphlet. He she get started in that program I think it would help her develop a habit that she could later keep at home (she has a treadmill in the house which is not using).  She knows to call me for any problems that may develop before the next visit here. MAGRINAT,GUSTAV C    12/20/2013

## 2013-12-20 NOTE — Telephone Encounter (Signed)
per pof to sch pt appt-sch pt appt and gave pt copy of sch °

## 2013-12-22 ENCOUNTER — Other Ambulatory Visit: Payer: Self-pay | Admitting: Family Medicine

## 2013-12-22 ENCOUNTER — Other Ambulatory Visit (INDEPENDENT_AMBULATORY_CARE_PROVIDER_SITE_OTHER): Payer: Self-pay | Admitting: Surgery

## 2013-12-22 ENCOUNTER — Ambulatory Visit
Admission: RE | Admit: 2013-12-22 | Discharge: 2013-12-22 | Disposition: A | Discharge status: Home / Self Care | Payer: Medicare Other | Source: Ambulatory Visit | Attending: Oncology | Admitting: Oncology

## 2013-12-22 ENCOUNTER — Ambulatory Visit
Admission: RE | Admit: 2013-12-22 | Discharge: 2013-12-22 | Disposition: A | Discharge status: Home / Self Care | Payer: Medicare Other | Source: Ambulatory Visit | Attending: Family Medicine | Admitting: Family Medicine

## 2013-12-22 DIAGNOSIS — C50911 Malignant neoplasm of unspecified site of right female breast: Secondary | ICD-10-CM

## 2013-12-22 DIAGNOSIS — Z853 Personal history of malignant neoplasm of breast: Secondary | ICD-10-CM

## 2013-12-22 DIAGNOSIS — R928 Other abnormal and inconclusive findings on diagnostic imaging of breast: Secondary | ICD-10-CM | POA: Diagnosis not present

## 2014-01-18 DIAGNOSIS — Z23 Encounter for immunization: Secondary | ICD-10-CM | POA: Diagnosis not present

## 2014-03-01 DIAGNOSIS — H04123 Dry eye syndrome of bilateral lacrimal glands: Secondary | ICD-10-CM | POA: Diagnosis not present

## 2014-03-01 DIAGNOSIS — H2513 Age-related nuclear cataract, bilateral: Secondary | ICD-10-CM | POA: Diagnosis not present

## 2014-03-01 DIAGNOSIS — H40013 Open angle with borderline findings, low risk, bilateral: Secondary | ICD-10-CM | POA: Diagnosis not present

## 2014-05-24 DIAGNOSIS — H04123 Dry eye syndrome of bilateral lacrimal glands: Secondary | ICD-10-CM | POA: Diagnosis not present

## 2014-05-24 DIAGNOSIS — Q100 Congenital ptosis: Secondary | ICD-10-CM | POA: Diagnosis not present

## 2014-06-07 DIAGNOSIS — D120 Benign neoplasm of cecum: Secondary | ICD-10-CM | POA: Diagnosis not present

## 2014-06-08 ENCOUNTER — Other Ambulatory Visit: Payer: Self-pay | Admitting: Gastroenterology

## 2014-06-08 DIAGNOSIS — Z09 Encounter for follow-up examination after completed treatment for conditions other than malignant neoplasm: Secondary | ICD-10-CM | POA: Diagnosis not present

## 2014-06-08 DIAGNOSIS — D120 Benign neoplasm of cecum: Secondary | ICD-10-CM | POA: Diagnosis not present

## 2014-06-08 DIAGNOSIS — K5730 Diverticulosis of large intestine without perforation or abscess without bleeding: Secondary | ICD-10-CM | POA: Diagnosis not present

## 2014-06-08 DIAGNOSIS — Z86010 Personal history of colonic polyps: Secondary | ICD-10-CM | POA: Diagnosis not present

## 2014-06-17 ENCOUNTER — Telehealth: Payer: Self-pay | Admitting: Oncology

## 2014-06-17 NOTE — Telephone Encounter (Signed)
S/w pt advised appt chg from 9/26 to 9/22 @12pm . Pt verbalized understanding.

## 2014-09-01 IMAGING — CR DG WRIST COMPLETE 3+V*R*
4 series · 4 of 4 positions shown · non-contrast
Comparison: None.

CLINICAL DATA: Fall, bilateral wrist pain

EXAM:
RIGHT WRIST - COMPLETE 3+ VIEW

[x wrist pa right]
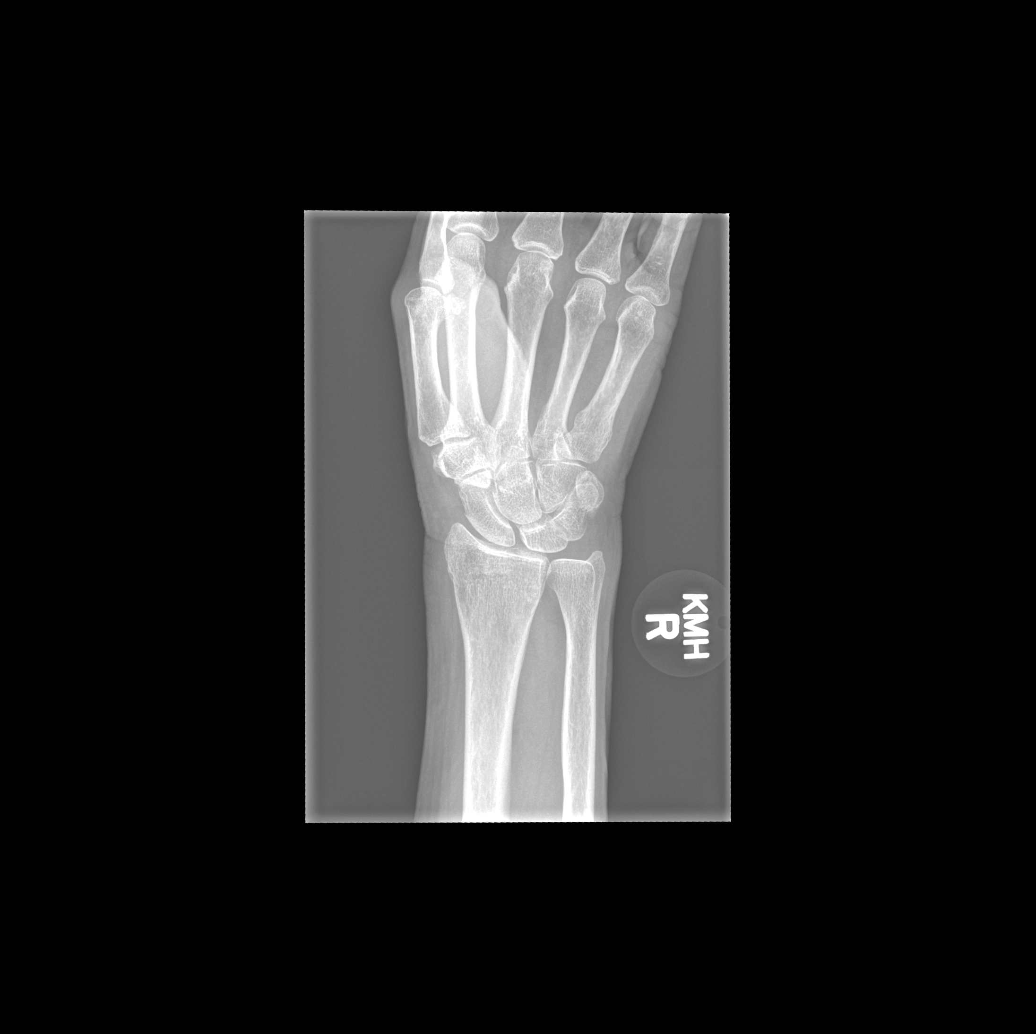

[x wrist obl right]
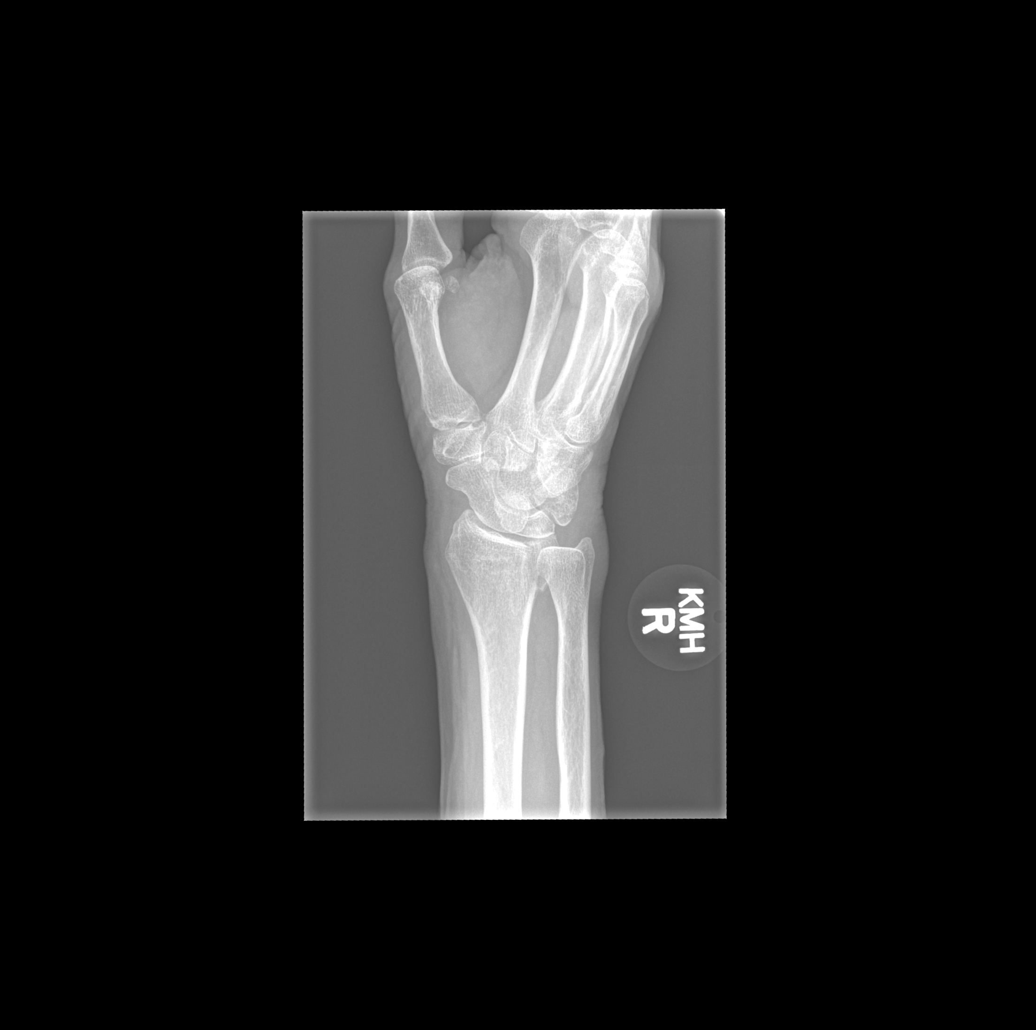

[x wrist lat right]
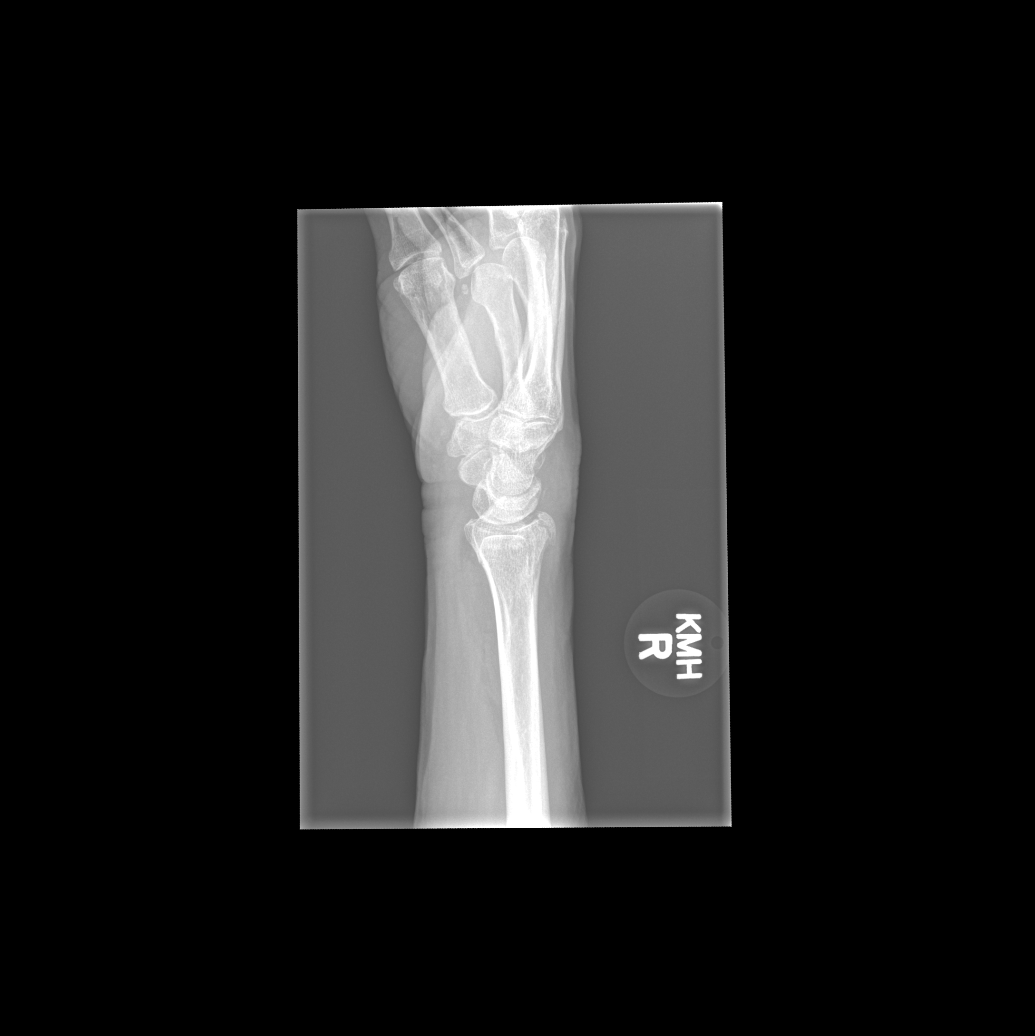

[x wrist navicular view right]
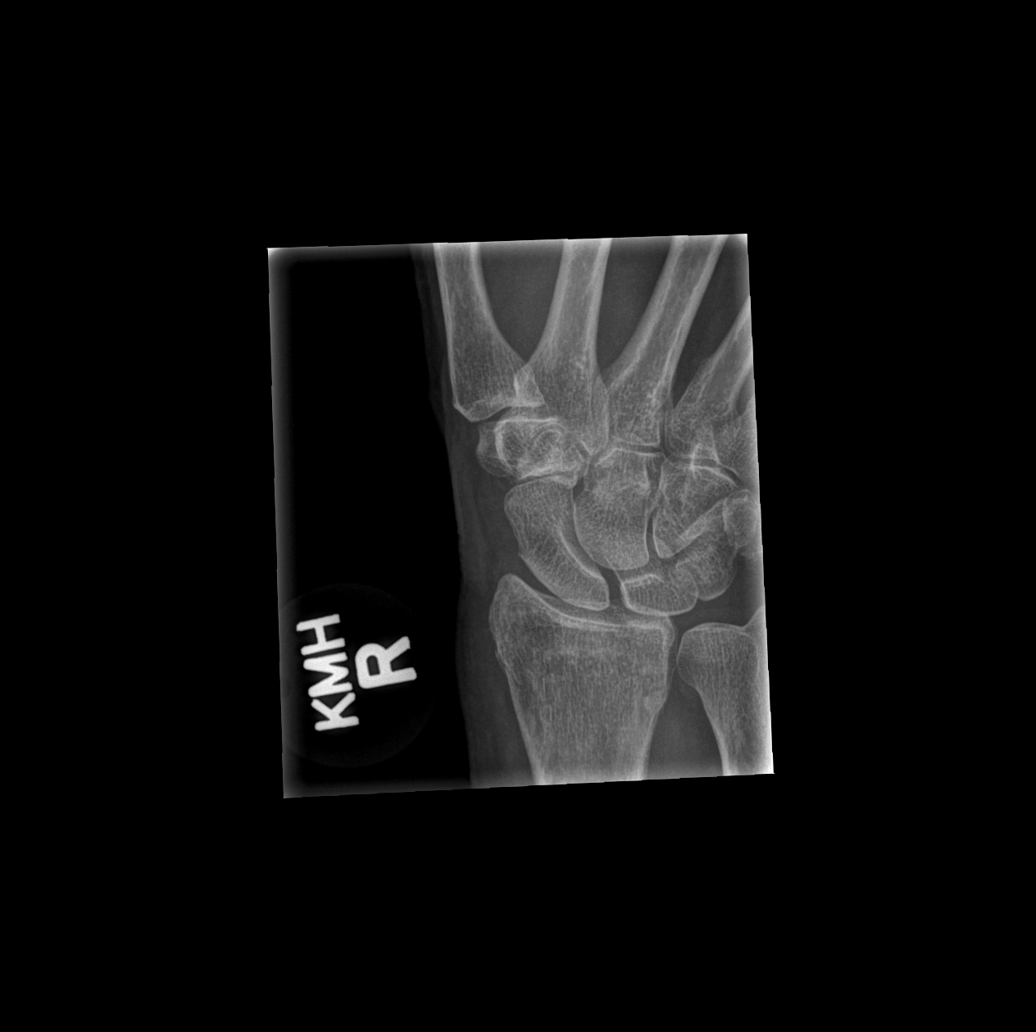

[4 of 4 positions shown; findings below may reference images not displayed]

FINDINGS: Four views of the right wrist submitted. There is subtle
nondisplaced fracture in distal right radius. This is best
visualized on oblique view.
IMPRESSION: Subtle nondisplaced fracture distal right radius.

## 2014-09-12 DIAGNOSIS — J069 Acute upper respiratory infection, unspecified: Secondary | ICD-10-CM | POA: Diagnosis not present

## 2014-09-12 DIAGNOSIS — J209 Acute bronchitis, unspecified: Secondary | ICD-10-CM | POA: Diagnosis not present

## 2014-09-24 IMAGING — CT CT HEAD W/O CM
2 series · 16 of 30 positions shown, 18 images · non-contrast
Comparison: None.

CLINICAL DATA: Fall 3 weeks ago with head injury.  Memory loss.

EXAM:
CT HEAD WITHOUT CONTRAST
TECHNIQUE: Contiguous axial images were obtained from the base of the skull
through the vertex without intravenous contrast.

[Series 2: head w/o · axial · non-contrast · 0.43mm/px · z∈[+13,+120]mm · 8 of 28 slices shown, 10 images]
[im 4/28  brain]
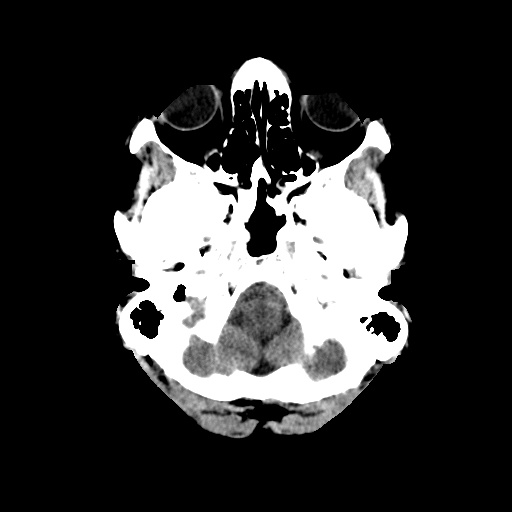
[im 4/28  bone]
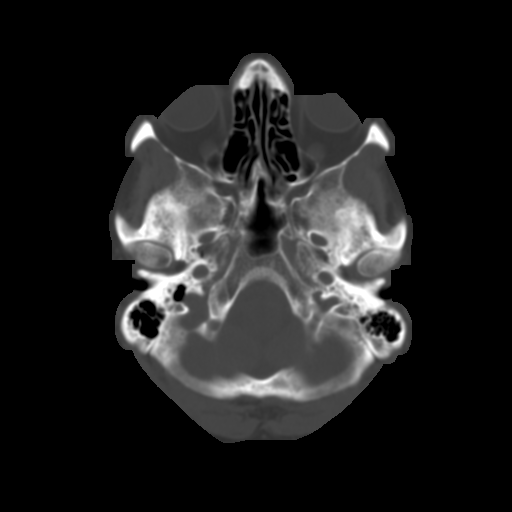
[im 7/28  brain]
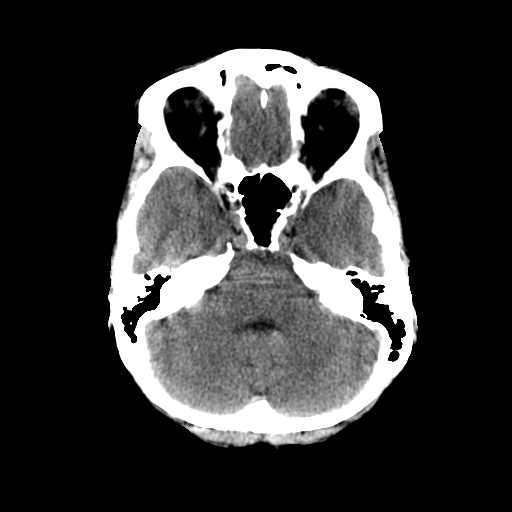
[im 10/28  brain]
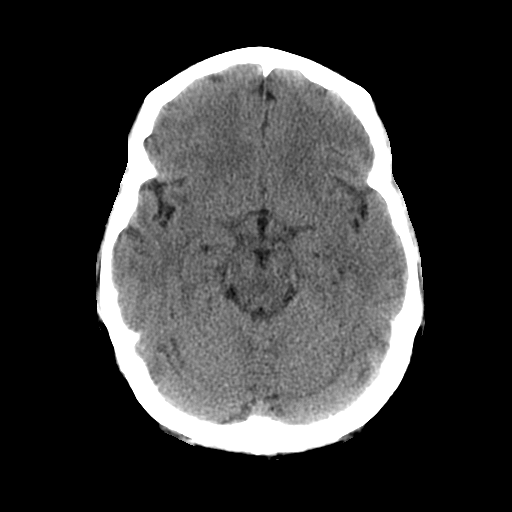
[im 13/28  brain]
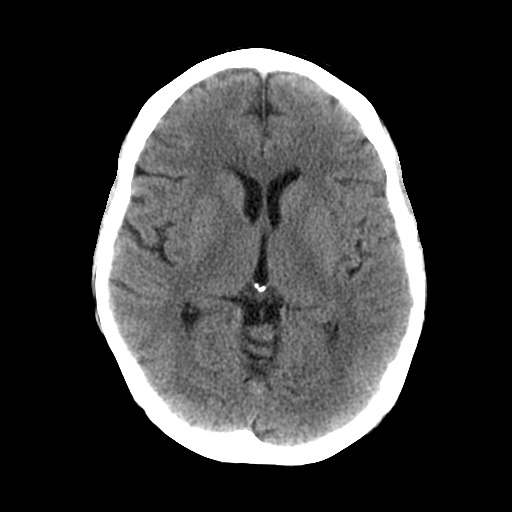
[im 16/28  brain]
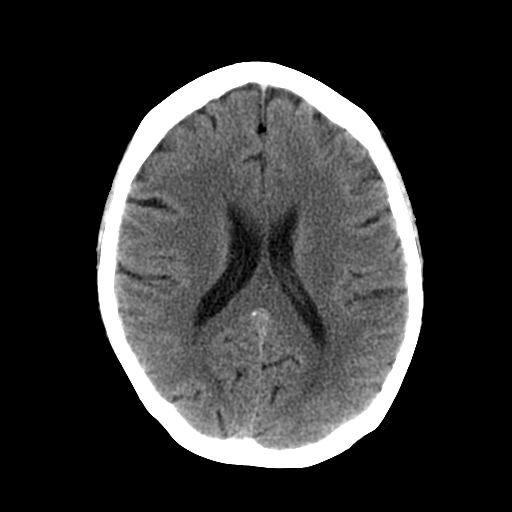
[im 16/28  bone]
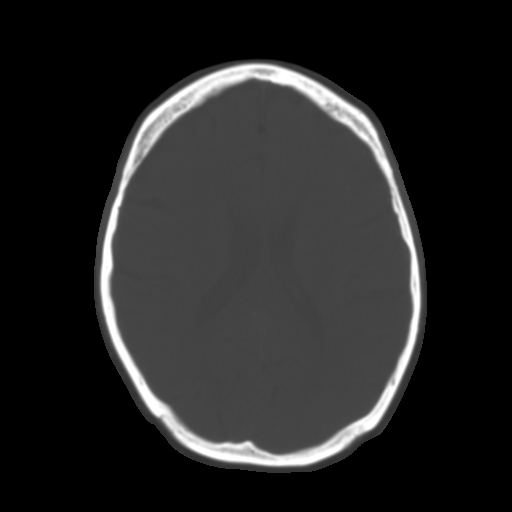
[im 19/28  brain]
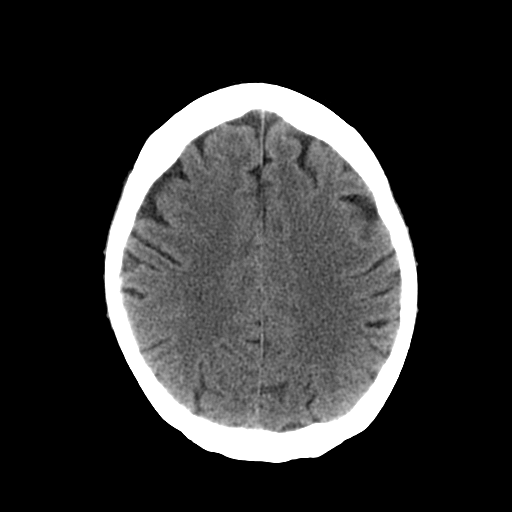
[im 22/28  brain]
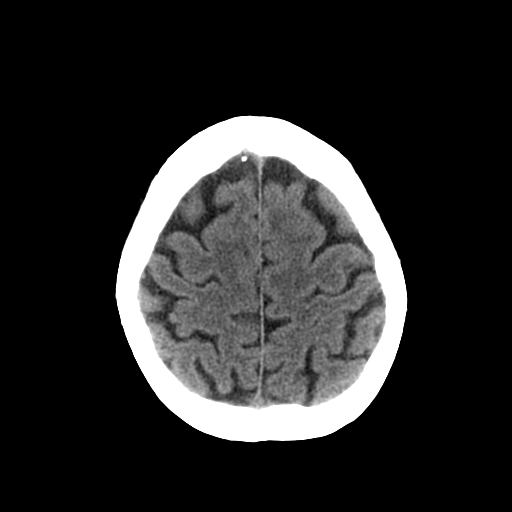
[im 25/28  brain]
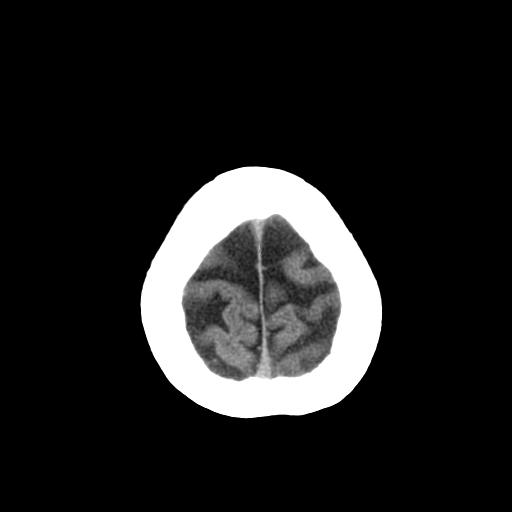

[Series 3: head bone · axial · 0.43mm/px · z∈[+10,+121]mm · 8 of 56 slices shown]
[im 6/56  bone]
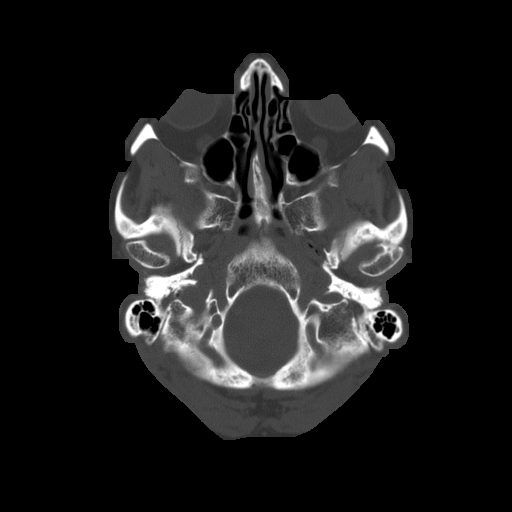
[im 12/56  bone]
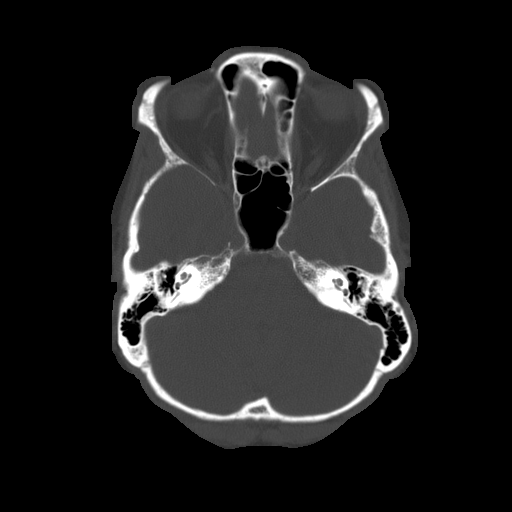
[im 18/56  bone]
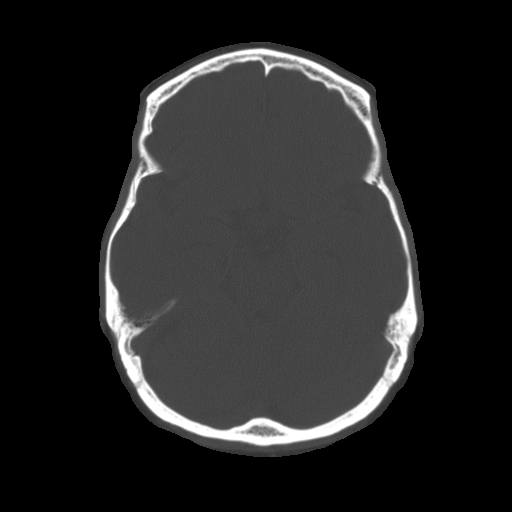
[im 24/56  bone]
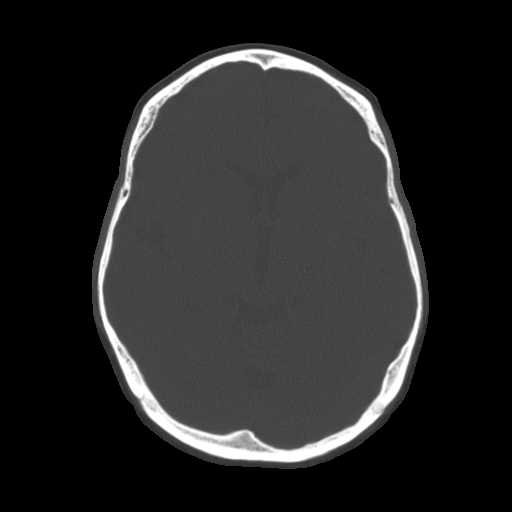
[im 32/56  bone]
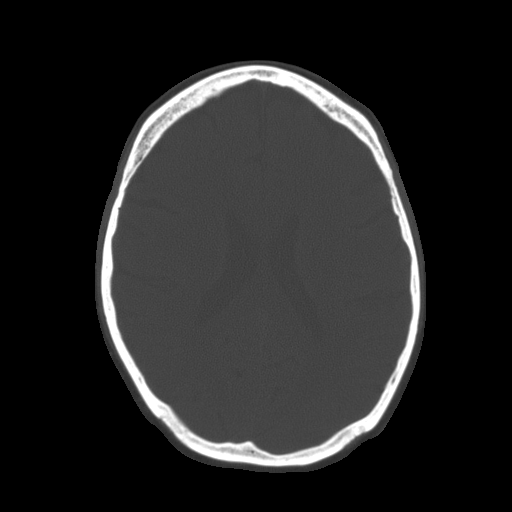
[im 38/56  bone]
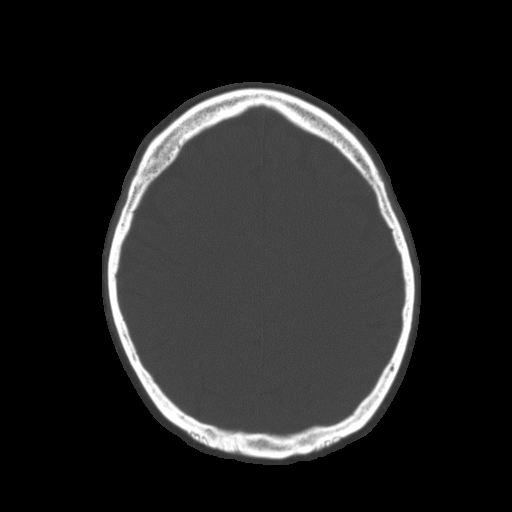
[im 44/56  bone]
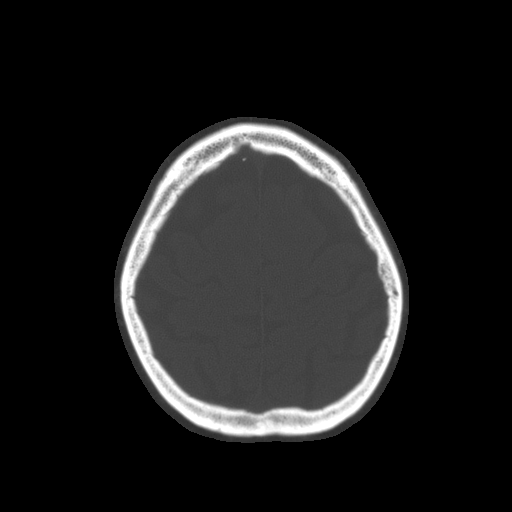
[im 50/56  bone]
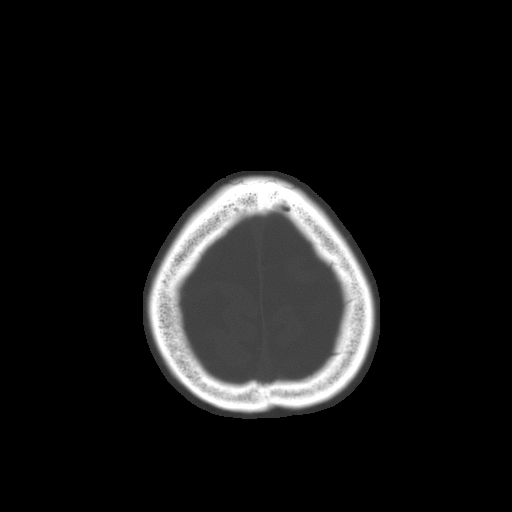

[16 of 30 positions shown; findings below may reference images not displayed]

FINDINGS: Ventricle size is normal. Negative for acute or chronic infarction.
Negative for hemorrhage or fluid collection. Negative for mass or
edema. No shift of the midline structures.

Calvarium is intact.  Mild mucosal thickening right sphenoid sinus.
IMPRESSION: Negative

## 2014-09-26 ENCOUNTER — Other Ambulatory Visit: Payer: Self-pay

## 2014-10-07 DIAGNOSIS — H04123 Dry eye syndrome of bilateral lacrimal glands: Secondary | ICD-10-CM | POA: Diagnosis not present

## 2014-10-07 DIAGNOSIS — H40013 Open angle with borderline findings, low risk, bilateral: Secondary | ICD-10-CM | POA: Diagnosis not present

## 2014-10-07 DIAGNOSIS — H02402 Unspecified ptosis of left eyelid: Secondary | ICD-10-CM | POA: Diagnosis not present

## 2014-11-29 ENCOUNTER — Other Ambulatory Visit: Payer: Self-pay | Admitting: Oncology

## 2014-11-29 ENCOUNTER — Other Ambulatory Visit: Payer: Self-pay

## 2014-11-29 DIAGNOSIS — C50911 Malignant neoplasm of unspecified site of right female breast: Secondary | ICD-10-CM

## 2014-11-29 DIAGNOSIS — Z9889 Other specified postprocedural states: Secondary | ICD-10-CM

## 2014-12-04 DIAGNOSIS — J209 Acute bronchitis, unspecified: Secondary | ICD-10-CM | POA: Diagnosis not present

## 2014-12-09 DIAGNOSIS — J45909 Unspecified asthma, uncomplicated: Secondary | ICD-10-CM | POA: Diagnosis not present

## 2014-12-09 DIAGNOSIS — J069 Acute upper respiratory infection, unspecified: Secondary | ICD-10-CM | POA: Diagnosis not present

## 2014-12-16 ENCOUNTER — Other Ambulatory Visit: Payer: Self-pay | Admitting: *Deleted

## 2014-12-16 DIAGNOSIS — C50219 Malignant neoplasm of upper-inner quadrant of unspecified female breast: Secondary | ICD-10-CM

## 2014-12-19 ENCOUNTER — Other Ambulatory Visit (HOSPITAL_BASED_OUTPATIENT_CLINIC_OR_DEPARTMENT_OTHER): Payer: Medicare Other

## 2014-12-19 DIAGNOSIS — Z853 Personal history of malignant neoplasm of breast: Secondary | ICD-10-CM | POA: Diagnosis present

## 2014-12-19 DIAGNOSIS — C50219 Malignant neoplasm of upper-inner quadrant of unspecified female breast: Secondary | ICD-10-CM

## 2014-12-19 LAB — COMPREHENSIVE METABOLIC PANEL (CC13)
ALBUMIN: 3.2 g/dL — AB (ref 3.5–5.0)
ALK PHOS: 64 U/L (ref 40–150)
ALT: 18 U/L (ref 0–55)
AST: 13 U/L (ref 5–34)
Anion Gap: 6 mEq/L (ref 3–11)
BILIRUBIN TOTAL: 0.86 mg/dL (ref 0.20–1.20)
BUN: 16.8 mg/dL (ref 7.0–26.0)
CALCIUM: 9 mg/dL (ref 8.4–10.4)
CO2: 25 meq/L (ref 22–29)
CREATININE: 1 mg/dL (ref 0.6–1.1)
Chloride: 110 mEq/L — ABNORMAL HIGH (ref 98–109)
EGFR: 57 mL/min/{1.73_m2} — AB (ref >90)
Glucose: 103 mg/dl (ref 70–140)
Potassium: 4.3 mEq/L (ref 3.5–5.1)
Sodium: 141 mEq/L (ref 136–145)
TOTAL PROTEIN: 5.9 g/dL — AB (ref 6.4–8.3)

## 2014-12-19 LAB — CBC WITH DIFFERENTIAL/PLATELET
BASO%: 0.5 % (ref 0.0–2.0)
Basophils Absolute: 0 10*3/uL (ref 0.0–0.1)
EOS%: 0.8 % (ref 0.0–7.0)
Eosinophils Absolute: 0 10*3/uL (ref 0.0–0.5)
HEMATOCRIT: 40.3 % (ref 34.8–46.6)
HEMOGLOBIN: 13.3 g/dL (ref 11.6–15.9)
LYMPH#: 1.6 10*3/uL (ref 0.9–3.3)
LYMPH%: 24.9 % (ref 14.0–49.7)
MCH: 30.3 pg (ref 25.1–34.0)
MCHC: 33.1 g/dL (ref 31.5–36.0)
MCV: 91.6 fL (ref 79.5–101.0)
MONO#: 0.4 10*3/uL (ref 0.1–0.9)
MONO%: 5.8 % (ref 0.0–14.0)
NEUT%: 68 % (ref 38.4–76.8)
NEUTROS ABS: 4.3 10*3/uL (ref 1.5–6.5)
Platelets: 213 10*3/uL (ref 145–400)
RBC: 4.4 10*6/uL (ref 3.70–5.45)
RDW: 13.1 % (ref 11.2–14.5)
WBC: 6.3 10*3/uL (ref 3.9–10.3)

## 2014-12-22 ENCOUNTER — Ambulatory Visit (HOSPITAL_BASED_OUTPATIENT_CLINIC_OR_DEPARTMENT_OTHER): Payer: Medicare Other | Admitting: Oncology

## 2014-12-22 VITALS — BP 109/62 | HR 61 | Temp 97.5°F | Resp 18 | Ht 65.0 in | Wt 132.0 lb

## 2014-12-22 DIAGNOSIS — C50211 Malignant neoplasm of upper-inner quadrant of right female breast: Secondary | ICD-10-CM | POA: Diagnosis not present

## 2014-12-22 NOTE — Progress Notes (Signed)
ID: [name]   DOB: 08/06/1940  MR#: [id number]  SJG#:[id number]  PCP: Vena Austria, MD GYN: Arvella Nigh SU: Osborn Coho OTHER MD: Lavonna Monarch  HISTORY OF PRESENT ILLNESS: From the original intake note:  [name] had neglected her health maintenance for the last several years, partly because she has been so concerned about her sister who died from pancreatic cancer this past March. At any rate, recently she was putting on her bra and noticed a change in the lateral aspect of her right breast. She brought it to Dr. Noland Fordyce attention, and she was set up for bilateral diagnostic mammography at the breast center November 21, 2010. This found her breasts to be heterogeneously dense, and in the upper outer quadrant there was an irregular mass measuring approximately 2.7 cm.   On physical exam, Dr. Karlton Lemon was able to palpate a soft mass measuring approximately 2 cm which on ultrasound had irregular borders and was hypoechoic. It measured 1.7 cm sonographically. Evaluation of the right axilla was negative.   On the same day, the patient proceeded to ultrasound-guided core biopsy, and the pathology from this procedure ([id number]) showed an invasive mammary carcinoma with histiocytoid features, triple negative, with a proliferation marker by MIB-1 of 8%.   [name] case was discussed at the multidisciplinary breast cancer conference on September 26th. The cells were certainly unusual looking with a significant amount of cytoplasmic vacuolation. The fact that she has one of the so called "low-grade triple negative tumors" led to much discussion, and the feeling was that chemotherapy was not automatic in this case but certainly needed to be considered.    The patient underwent right lumpectomy and sentinel lymph node sampling on 01/03/2011 for a T2N0, stage II breast carcinoma.  She decided to forgo chemotherapy. She received radiation therapy, completed mid December of 2012. Her subsequent  history is as detailed below.  INTERVAL HISTORY: [name] returns today for followup of her estrogen receptor positive breast carcinoma accompanied by her friend [name]. The recent history is significant for at episode of asthmatic bronchitis that put her in bed for about 2 weeks. She is not sure whether or not she had a pneumonia. All this developed while she was out campaigning with her daughter in the state of [place] in [place]. She was started on Levaquin while there through an urgent care center and then started on steroids here by Dr. Joneen Caraway. She tells me her symptoms have greatly improved and have pretty much resolved. She tells me chest x-rays have not been obtained  REVIEW OF SYSTEMS: She does feels fatigued to some extent, but is now approximating her usual activity level. She is not exercising regularly. She has mild dry cough which is not constant and only rarely productive of clear phlegm. She has no pleurisy. A detailed review of systems today was otherwise noncontributory  PAST MEDICAL HISTORY: Past Medical History  Diagnosis Date  . Hyperlipidemia   . Depression     mild  . History of breast cancer DX 11-21-2011--  S/P LUMPECTOMY AND RADIATION -- FOLLOWED BY DR Griffith Citron  . History of hypotension   . Heart palpitations occasional -- avoids caffine  . Frequency of urination   . Nocturia   . Breast cancer, IDC, Right, UIQ, Stage II, triple negative 11/21/2010    Invasive ductal carcinoma with histiocytic features.Lumpectomy &SLN on 01/03/11. Grade II tumor, 2.3 cm, ER 0%, PR 0%, Her 2 -, Ki76 8%. One SLN negative. Declined chemo.Radiatiion:TREATMENT DATES: 02/13/2011 through 03/13/2011  SITE/DOSE: Right breast 4250 cGy 17 sessions. Right breast boost 750 cGy 3 sessions  BEAMS/ENERGY: 6 MV photons tangential fields to the right breast. 15 MEV electrons, rig    PAST SURGICAL HISTORY: Past Surgical History  Procedure Laterality Date  . Breast lumpectomy w/ needle localization   01/03/2011    Right, w SLN Dr Margot Chimes  . Breast surgery  1962    removal benign left breast tumor   . Hysteroscopy w/ endometrial ablation  1980  . Laparoscopic bilateral salpingo oopherectomy  03/12/2012    Procedure: LAPAROSCOPIC BILATERAL SALPINGO OOPHORECTOMY;  Surgeon: Darlyn Chamber, MD;  Location: [facility];  Service: Gynecology;  Laterality: Bilateral;  . Cystoscopy  03/12/2012    Procedure: CYSTOSCOPY;  Surgeon: Darlyn Chamber, MD;  Location: [facility];  Service: Gynecology;  Laterality: N/A;    FAMILY HISTORY Family History  Problem Relation Age of Onset  . Cancer Mother     stomach  . Cancer Maternal Uncle     colon  . Cancer Sister     pancreatic  There is no history of breast cancer in the family. There is no history of ovarian cancer in the family to her knowledge.   GYNECOLOGIC HISTORY: Menarche at age 59, last period in 62. She is GX, P4, first live birth at age 39. She never used hormones or birth control pills. 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 Her daughter, Lehman Prom, lives in [place], [place]. 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 Daughter, Sidrah Harden, lives in [place], and she has a Brewing technologist in social work working primarily with prisoners. Son Laban Emperor [place],  lives in [place]. He is a Administrator. Son Atara Paterson, also lives in [place] and works at [employer] in [place]. The patient attends [employer]. She has 6 grandchildren.      ADVANCED DIRECTIVES: Not in place  HEALTH MAINTENANCE: Social History  Substance Use Topics  . Smoking status: Former Smoker -- 1.00 packs/day for 20 years    Types: Cigarettes    Quit  date: 03/11/1979  . Smokeless tobacco: Never Used  . Alcohol Use: No     Colonoscopy: 2011, [facility]  PAP: approx 2010  Bone density: 2013, osteoporosis  Lipid panel:   Allergies  Allergen Reactions  . Contrast Media [Iodinated Diagnostic Agents] Anaphylaxis    AREA INFLAMMED AND SWELLING W/ BREAST NEEDLE LOCALIZATION  . Penicillins Other (See Comments)    Patient does not know of reaction. Was based on a scratch test.    Current Outpatient Prescriptions  Medication Sig Dispense Refill  . pravastatin (PRAVACHOL) 40 MG tablet Take 40 mg by mouth every evening.      No current facility-administered medications for this visit.    OBJECTIVE: Middle-aged white woman who appears well Filed Vitals:   12/22/14 1159  BP: 109/62  Pulse: 61  Temp: 97.5 F (36.4 C)  Resp: 18     Body mass index is 21.97 kg/(m^2).    ECOG FS: 0 Filed Weights   12/22/14 1159  Weight: 132 lb (59.875 kg)   Sclerae unicteric, pupils round and equal Oropharynx clear and moist-- no thrush or other lesions No cervical or supraclavicular adenopathy  Lungs no rales or rhonchi, no wheezes, good excursion bilaterally Heart regular rate and rhythm Abd soft, nontender, positive bowel sounds MSK no focal spinal tenderness, no upper extremity lymphedema Neuro: nonfocal, well oriented, appropriate affect Breasts: The right breast is status post lumpectomy and radiation. The cosmetic result is good. There is no evidence of local recurrence. The right axilla is benign. The left breast is unremarkable.    LAB RESULTS: Lab Results  Component Value Date   WBC 6.3 12/19/2014   NEUTROABS 4.3 12/19/2014   HGB 13.3 12/19/2014   HCT 40.3 12/19/2014   MCV 91.6 12/19/2014   PLT 213 12/19/2014      Chemistry      Component Value Date/Time   NA 141 12/19/2014 1209   NA 140 10/08/2011 1132   K 4.3 12/19/2014 1209   K 4.3 10/08/2011 1132   CL 106 05/19/2012 1052   CL 107 10/08/2011 1132   CO2 25 12/19/2014 1209    CO2 28 10/08/2011 1132   BUN 16.8 12/19/2014 1209   BUN 20 10/08/2011 1132   CREATININE 1.0 12/19/2014 1209   CREATININE 0.92 10/08/2011 1132      Component Value Date/Time   CALCIUM 9.0 12/19/2014 1209   CALCIUM 10.1 10/08/2011 1132   ALKPHOS 64 12/19/2014 1209   ALKPHOS 59 10/08/2011 1132   AST 13 12/19/2014 1209   AST 20 10/08/2011 1132   ALT 18 12/19/2014 1209   ALT 18 10/08/2011 1132   BILITOT 0.86 12/19/2014 1209   BILITOT 1.5* 01/07/2012 1330       STUDIES: Mammography scheduled for 12/26/2014  ASSESSMENT: 74 y.o.  BRCA negative [place] woman of Ashkenazi Jewish ancestry  (1)  status post right lumpectomy and sentinel lymph node sampling 01/03/2011 for a T2 N0, stage IIA breast cancer with histiocytoid features, triple negative, grade 2, with an MIB-1 of 8%, the patient deciding to forgo chemotherapy  (2)  status post radiation completed mid December 2012  (3) status post bilateral salpingo-oophorectomy 03/12/2012 with benign pathology   PLAN: Ashunti will have her mammography next week. I am expecting this to be unremarkable. She will then have mammography again September 2017  Accordingly I will see her October of next year, so we will have the mammography results. Assuming everything continues well, she will graduate at that time.  She knows to wear pink for that visit. She also knows to call for any other problems that may develop before her return here   [place]    12/22/2014

## 2014-12-23 ENCOUNTER — Telehealth: Payer: Self-pay | Admitting: Oncology

## 2014-12-23 NOTE — Telephone Encounter (Signed)
Mailed calendar for October 2017

## 2014-12-26 ENCOUNTER — Ambulatory Visit: Payer: PRIVATE HEALTH INSURANCE | Admitting: Oncology

## 2014-12-26 ENCOUNTER — Ambulatory Visit
Admission: RE | Admit: 2014-12-26 | Discharge: 2014-12-26 | Disposition: A | Discharge status: Home / Self Care | Payer: Medicare Other | Source: Ambulatory Visit | Attending: Oncology | Admitting: Oncology

## 2014-12-26 DIAGNOSIS — Z9889 Other specified postprocedural states: Secondary | ICD-10-CM

## 2014-12-26 DIAGNOSIS — R928 Other abnormal and inconclusive findings on diagnostic imaging of breast: Secondary | ICD-10-CM | POA: Diagnosis not present

## 2014-12-26 DIAGNOSIS — Z853 Personal history of malignant neoplasm of breast: Secondary | ICD-10-CM | POA: Diagnosis not present

## 2014-12-26 DIAGNOSIS — C50911 Malignant neoplasm of unspecified site of right female breast: Secondary | ICD-10-CM

## 2015-01-13 DIAGNOSIS — Z23 Encounter for immunization: Secondary | ICD-10-CM | POA: Diagnosis not present

## 2015-03-16 DIAGNOSIS — Z1389 Encounter for screening for other disorder: Secondary | ICD-10-CM | POA: Diagnosis not present

## 2015-03-16 DIAGNOSIS — Z23 Encounter for immunization: Secondary | ICD-10-CM | POA: Diagnosis not present

## 2015-03-16 DIAGNOSIS — Z0000 Encounter for general adult medical examination without abnormal findings: Secondary | ICD-10-CM | POA: Diagnosis not present

## 2015-03-16 DIAGNOSIS — M859 Disorder of bone density and structure, unspecified: Secondary | ICD-10-CM | POA: Diagnosis not present

## 2015-03-16 DIAGNOSIS — E782 Mixed hyperlipidemia: Secondary | ICD-10-CM | POA: Diagnosis not present

## 2015-03-21 DIAGNOSIS — E785 Hyperlipidemia, unspecified: Secondary | ICD-10-CM | POA: Diagnosis not present

## 2015-04-11 DIAGNOSIS — H524 Presbyopia: Secondary | ICD-10-CM | POA: Diagnosis not present

## 2015-04-11 DIAGNOSIS — H04123 Dry eye syndrome of bilateral lacrimal glands: Secondary | ICD-10-CM | POA: Diagnosis not present

## 2015-04-11 DIAGNOSIS — H2513 Age-related nuclear cataract, bilateral: Secondary | ICD-10-CM | POA: Diagnosis not present

## 2015-04-11 DIAGNOSIS — H5201 Hypermetropia, right eye: Secondary | ICD-10-CM | POA: Diagnosis not present

## 2015-04-11 DIAGNOSIS — H40013 Open angle with borderline findings, low risk, bilateral: Secondary | ICD-10-CM | POA: Diagnosis not present

## 2015-04-11 DIAGNOSIS — H52222 Regular astigmatism, left eye: Secondary | ICD-10-CM | POA: Diagnosis not present

## 2015-04-11 DIAGNOSIS — H02402 Unspecified ptosis of left eyelid: Secondary | ICD-10-CM | POA: Diagnosis not present

## 2015-04-11 DIAGNOSIS — H52221 Regular astigmatism, right eye: Secondary | ICD-10-CM | POA: Diagnosis not present

## 2015-04-13 DIAGNOSIS — M810 Age-related osteoporosis without current pathological fracture: Secondary | ICD-10-CM | POA: Diagnosis not present

## 2015-10-13 ENCOUNTER — Encounter: Payer: Self-pay | Admitting: Genetic Counselor

## 2015-10-25 DIAGNOSIS — H04123 Dry eye syndrome of bilateral lacrimal glands: Secondary | ICD-10-CM | POA: Diagnosis not present

## 2015-10-25 DIAGNOSIS — H40013 Open angle with borderline findings, low risk, bilateral: Secondary | ICD-10-CM | POA: Diagnosis not present

## 2015-10-25 DIAGNOSIS — H02402 Unspecified ptosis of left eyelid: Secondary | ICD-10-CM | POA: Diagnosis not present

## 2015-10-26 DIAGNOSIS — H15001 Unspecified scleritis, right eye: Secondary | ICD-10-CM | POA: Diagnosis not present

## 2015-11-28 ENCOUNTER — Encounter: Payer: Self-pay | Admitting: Oncology

## 2015-12-22 ENCOUNTER — Other Ambulatory Visit: Payer: Self-pay | Admitting: Oncology

## 2015-12-22 DIAGNOSIS — Z853 Personal history of malignant neoplasm of breast: Secondary | ICD-10-CM

## 2015-12-26 DIAGNOSIS — Z23 Encounter for immunization: Secondary | ICD-10-CM | POA: Diagnosis not present

## 2015-12-27 ENCOUNTER — Ambulatory Visit
Admission: RE | Admit: 2015-12-27 | Discharge: 2015-12-27 | Disposition: A | Discharge status: Home / Self Care | Payer: Medicare Other | Source: Ambulatory Visit | Attending: Oncology | Admitting: Oncology

## 2015-12-27 DIAGNOSIS — R928 Other abnormal and inconclusive findings on diagnostic imaging of breast: Secondary | ICD-10-CM | POA: Diagnosis not present

## 2015-12-27 DIAGNOSIS — Z853 Personal history of malignant neoplasm of breast: Secondary | ICD-10-CM

## 2016-01-03 ENCOUNTER — Other Ambulatory Visit: Payer: Self-pay | Admitting: *Deleted

## 2016-01-03 DIAGNOSIS — C50211 Malignant neoplasm of upper-inner quadrant of right female breast: Secondary | ICD-10-CM

## 2016-01-04 ENCOUNTER — Other Ambulatory Visit (HOSPITAL_BASED_OUTPATIENT_CLINIC_OR_DEPARTMENT_OTHER): Payer: Medicare Other

## 2016-01-04 DIAGNOSIS — C50211 Malignant neoplasm of upper-inner quadrant of right female breast: Secondary | ICD-10-CM

## 2016-01-04 LAB — CBC WITH DIFFERENTIAL/PLATELET
BASO%: 0.4 % (ref 0.0–2.0)
BASOS ABS: 0 10*3/uL (ref 0.0–0.1)
EOS%: 1.6 % (ref 0.0–7.0)
Eosinophils Absolute: 0.1 10*3/uL (ref 0.0–0.5)
HCT: 39.6 % (ref 34.8–46.6)
HGB: 13.3 g/dL (ref 11.6–15.9)
LYMPH%: 28.3 % (ref 14.0–49.7)
MCH: 30.6 pg (ref 25.1–34.0)
MCHC: 33.6 g/dL (ref 31.5–36.0)
MCV: 91.2 fL (ref 79.5–101.0)
MONO#: 0.3 10*3/uL (ref 0.1–0.9)
MONO%: 5.7 % (ref 0.0–14.0)
NEUT#: 3.2 10*3/uL (ref 1.5–6.5)
NEUT%: 64 % (ref 38.4–76.8)
Platelets: 180 10*3/uL (ref 145–400)
RBC: 4.34 10*6/uL (ref 3.70–5.45)
RDW: 12.8 % (ref 11.2–14.5)
WBC: 5.1 10*3/uL (ref 3.9–10.3)
lymph#: 1.4 10*3/uL (ref 0.9–3.3)

## 2016-01-04 LAB — COMPREHENSIVE METABOLIC PANEL
ALT: 16 U/L (ref 0–55)
AST: 15 U/L (ref 5–34)
Albumin: 3.5 g/dL (ref 3.5–5.0)
Alkaline Phosphatase: 72 U/L (ref 40–150)
Anion Gap: 8 mEq/L (ref 3–11)
BUN: 17.7 mg/dL (ref 7.0–26.0)
CALCIUM: 9.4 mg/dL (ref 8.4–10.4)
CHLORIDE: 111 meq/L — AB (ref 98–109)
CO2: 24 mEq/L (ref 22–29)
Creatinine: 1 mg/dL (ref 0.6–1.1)
EGFR: 55 mL/min/{1.73_m2} — AB (ref >90)
Glucose: 109 mg/dl (ref 70–140)
POTASSIUM: 4.2 meq/L (ref 3.5–5.1)
Sodium: 143 mEq/L (ref 136–145)
Total Bilirubin: 0.95 mg/dL (ref 0.20–1.20)
Total Protein: 6.7 g/dL (ref 6.4–8.3)

## 2016-01-11 ENCOUNTER — Ambulatory Visit (HOSPITAL_BASED_OUTPATIENT_CLINIC_OR_DEPARTMENT_OTHER): Payer: Medicare Other | Admitting: Oncology

## 2016-01-11 VITALS — BP 91/70 | HR 62 | Temp 98.0°F | Resp 18 | Ht 65.0 in | Wt 140.9 lb

## 2016-01-11 DIAGNOSIS — C50211 Malignant neoplasm of upper-inner quadrant of right female breast: Secondary | ICD-10-CM

## 2016-01-11 DIAGNOSIS — Z853 Personal history of malignant neoplasm of breast: Secondary | ICD-10-CM

## 2016-01-11 NOTE — Progress Notes (Signed)
ID: [name]   DOB: 14-Dec-1940  MR#: [id number]  FGB#:[id number]  PCP: Vena Austria, MD GYN: Arvella Nigh SU: Osborn Coho OTHER MD: Lavonna Monarch  HISTORY OF PRESENT ILLNESS: From the original intake note:  [name] had neglected her health maintenance for the last several years, partly because she has been so concerned about her sister who died from pancreatic cancer this past March. At any rate, recently she was putting on her bra and noticed a change in the lateral aspect of her right breast. She brought it to Dr. Noland Fordyce attention, and she was set up for bilateral diagnostic mammography at the breast center November 21, 2010. This found her breasts to be heterogeneously dense, and in the upper outer quadrant there was an irregular mass measuring approximately 2.7 cm.   On physical exam, Dr. Karlton Lemon was able to palpate a soft mass measuring approximately 2 cm which on ultrasound had irregular borders and was hypoechoic. It measured 1.7 cm sonographically. Evaluation of the right axilla was negative.   On the same day, the patient proceeded to ultrasound-guided core biopsy, and the pathology from this procedure ([id number]) showed an invasive mammary carcinoma with histiocytoid features, triple negative, with a proliferation marker by MIB-1 of 8%.   [name] case was discussed at the multidisciplinary breast cancer conference on September 26th. The cells were certainly unusual looking with a significant amount of cytoplasmic vacuolation. The fact that she has one of the so called "low-grade triple negative tumors" led to much discussion, and the feeling was that chemotherapy was not automatic in this case but certainly needed to be considered.    The patient underwent right lumpectomy and sentinel lymph node sampling on 01/03/2011 for a T2N0, stage II breast carcinoma.  She decided to forgo chemotherapy. She received radiation therapy, completed mid December of 2012. Her subsequent  history is as detailed below.  INTERVAL HISTORY: [name] returns today for followup of her estrogen receptor negative breast cancer. The interval history is unremarkable. She continues to be very active in general, although she does not follow a strict regular exercise program.  REVIEW OF SYSTEMS: There are significant social stresses which are not detailed here. A detailed review of systems today was entirely stable  PAST MEDICAL HISTORY: Past Medical History:  Diagnosis Date  . Breast cancer, IDC, Right, UIQ, Stage II, triple negative 11/21/2010   Invasive ductal carcinoma with histiocytic features.Lumpectomy &SLN on 01/03/11. Grade II tumor, 2.3 cm, ER 0%, PR 0%, Her 2 -, Ki76 8%. One SLN negative. Declined chemo.Radiatiion:TREATMENT DATES: 02/13/2011 through 03/13/2011  SITE/DOSE: Right breast 4250 cGy 17 sessions. Right breast boost 750 cGy 3 sessions  BEAMS/ENERGY: 6 MV photons tangential fields to the right breast. 15 MEV electrons, rig  . Depression    mild  . Frequency of urination   . Heart palpitations occasional -- avoids caffine  . History of breast cancer DX 11-21-2011--  S/P LUMPECTOMY AND RADIATION -- FOLLOWED BY DR Griffith Citron  . History of hypotension   . Hyperlipidemia   . Nocturia     PAST SURGICAL HISTORY: Past Surgical History:  Procedure Laterality Date  . BREAST LUMPECTOMY W/ NEEDLE LOCALIZATION  01/03/2011   Right, w SLN Dr Margot Chimes  . BREAST SURGERY  1962   removal benign left breast tumor   . CYSTOSCOPY  03/12/2012   Procedure: CYSTOSCOPY;  Surgeon: Darlyn Chamber, MD;  Location: [facility];  Service: Gynecology;  Laterality: N/A;  . HYSTEROSCOPY W/ ENDOMETRIAL ABLATION  [place] OOPHERECTOMY  03/12/2012   Procedure: LAPAROSCOPIC BILATERAL SALPINGO OOPHORECTOMY;  Surgeon: Darlyn Chamber, MD;  Location: [facility];  Service: Gynecology;  Laterality: Bilateral;    FAMILY HISTORY Family History  Problem  Relation Age of Onset  . Cancer Mother     stomach  . Cancer Maternal Uncle     colon  . Cancer Sister     pancreatic  There is no history of breast cancer in the family. There is no history of ovarian cancer in the family to her knowledge.   GYNECOLOGIC HISTORY: Menarche at age 22, last period in 59. She is GX, P4, first live birth at age 38. She never used hormones or birth control pills. 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 Her daughter, [name], lives in [place], [place]. 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 Daughter, [name], lives in [place], and she has a Brewing technologist in social work working primarily with prisoners. Son [name] lives in [place]. He is a Administrator. Son [name], also lives in [place] and works at [employer] in [place]. The patient attends [employer]. She has 6 grandchildren.      ADVANCED DIRECTIVES: Not in place  HEALTH MAINTENANCE: Social History  Substance Use Topics  . Smoking status: Former Smoker    Packs/day: 1.00    Years: 20.00    Types: Cigarettes    Quit date: 03/11/1979  . Smokeless tobacco: Never Used  . Alcohol use No     Colonoscopy: 2011, [facility]  PAP: approx 2010  Bone density: 2013, osteoporosis  Lipid panel:   Allergies  Allergen Reactions  . Contrast Media [Iodinated Diagnostic Agents] Anaphylaxis    AREA INFLAMMED AND SWELLING W/ BREAST NEEDLE LOCALIZATION  . Penicillins Other (See Comments)    Patient does not know of reaction. Was based on a scratch test.    Current Outpatient Prescriptions  Medication Sig Dispense Refill  . pravastatin (PRAVACHOL) 40 MG tablet Take 40 mg by mouth every evening.      No current  facility-administered medications for this visit.     OBJECTIVE: Middle-aged white woman In no acute distress Vitals:   01/11/16 1310  BP: 91/70  Pulse: 62  Resp: 18  Temp: 98 F (36.7 C)     Body mass index is 23.45 kg/m.    ECOG FS: 0 Filed Weights   01/11/16 1310  Weight: 140 lb 14.4 oz (63.9 kg)   Sclerae unicteric, EOMs intact Oropharynx clear and moist No cervical or supraclavicular adenopathy Lungs no rales or rhonchi Heart regular rate and rhythm Abd soft, nontender, positive bowel sounds MSK no focal spinal tenderness, no upper extremity lymphedema Neuro: nonfocal, well oriented, appropriate affect Breasts: The right breast is status post lumpectomy followed by radiation. There is no evidence of local recurrence. The right axilla is benign. Left breast is unremarkable.  LAB RESULTS: Lab Results  Component Value Date   WBC 5.1 01/04/2016   NEUTROABS 3.2 01/04/2016   HGB 13.3 01/04/2016   HCT 39.6 01/04/2016   MCV 91.2 01/04/2016   PLT 180 01/04/2016      Chemistry      Component Value Date/Time  NA 143 01/04/2016 1303   K 4.2 01/04/2016 1303   CL 106 05/19/2012 1052   CO2 24 01/04/2016 1303   BUN 17.7 01/04/2016 1303   CREATININE 1.0 01/04/2016 1303      Component Value Date/Time   CALCIUM 9.4 01/04/2016 1303   ALKPHOS 72 01/04/2016 1303   AST 15 01/04/2016 1303   ALT 16 01/04/2016 1303   BILITOT 0.95 01/04/2016 1303       STUDIES: Mm Diag Breast Tomo Bilateral  Result Date: 12/27/2015 CLINICAL DATA:  75 year old female for annual bilateral mammograms. History of right breast cancer and lumpectomy in 2012. EXAM: 2D DIGITAL DIAGNOSTIC BILATERAL MAMMOGRAM WITH CAD AND ADJUNCT TOMO COMPARISON:  Previous exam(s). ACR Breast Density Category b: There are scattered areas of fibroglandular density. FINDINGS: 2D and 3D full field views of both breasts and a magnification view of the right lumpectomy site performed. There is no evidence of suspicious mass,  nonsurgical distortion or worrisome calcifications. Scarring within the upper right breast again identified. Mammographic images were processed with CAD. IMPRESSION: No mammographic evidence of breast malignancy. RECOMMENDATION: Bilateral diagnostic mammograms in 1 year. I have discussed the findings and recommendations with the patient. Results were also provided in writing at the conclusion of the visit. If applicable, a reminder letter will be sent to the patient regarding the next appointment. BI-RADS CATEGORY  2: Benign. Electronically Signed   By: Margarette Canada M.D.   On: 12/27/2015 13:53     ASSESSMENT: 75 y.o.  BRCA negative  woman of Ashkenazi Jewish ancestry  (1)  status post right Breast upper inner quadrant lumpectomy and sentinel lymph node sampling 01/03/2011 for a T2 N0, stage IIA breast cancer with histiocytoid features, triple negative, grade 2, with an MIB-1 of 8%, the patient deciding to forgo chemotherapy  (2)  status post radiation completed mid December 2012  (3) status post bilateral salpingo-oophorectomy 03/12/2012 with benign pathology   PLAN: [name] is now 5 years out from definitive surgery for her estrogen receptor negative breast cancer. This is very favorable.  She understands that estrogen receptor negative tumors if they are going to occur tend to recur early.  Accordingly I feel comfortable releasing her to her primary care physician at this point. We'll she will need in terms of breast cancer follow-up is yearly mammography, which she usually obtains at the [facility] in September, and yearly physician breast exam.  I will be glad to see her at any point in the future on an as needed basis, but as of now we're making no further routine appointments for her here.  MAGRINAT,GUSTAV C    01/11/2016

## 2016-04-10 DIAGNOSIS — Z853 Personal history of malignant neoplasm of breast: Secondary | ICD-10-CM | POA: Diagnosis not present

## 2016-04-10 DIAGNOSIS — Z0000 Encounter for general adult medical examination without abnormal findings: Secondary | ICD-10-CM | POA: Diagnosis not present

## 2016-04-10 DIAGNOSIS — M810 Age-related osteoporosis without current pathological fracture: Secondary | ICD-10-CM | POA: Diagnosis not present

## 2016-04-10 DIAGNOSIS — E782 Mixed hyperlipidemia: Secondary | ICD-10-CM | POA: Diagnosis not present

## 2016-04-10 DIAGNOSIS — Z1389 Encounter for screening for other disorder: Secondary | ICD-10-CM | POA: Diagnosis not present

## 2016-05-06 DIAGNOSIS — E860 Dehydration: Secondary | ICD-10-CM | POA: Diagnosis not present

## 2016-05-14 DIAGNOSIS — H40013 Open angle with borderline findings, low risk, bilateral: Secondary | ICD-10-CM | POA: Diagnosis not present

## 2016-05-14 DIAGNOSIS — H02402 Unspecified ptosis of left eyelid: Secondary | ICD-10-CM | POA: Diagnosis not present

## 2016-05-14 DIAGNOSIS — H04123 Dry eye syndrome of bilateral lacrimal glands: Secondary | ICD-10-CM | POA: Diagnosis not present

## 2016-05-14 DIAGNOSIS — Q141 Congenital malformation of retina: Secondary | ICD-10-CM | POA: Diagnosis not present

## 2016-05-14 DIAGNOSIS — H2513 Age-related nuclear cataract, bilateral: Secondary | ICD-10-CM | POA: Diagnosis not present

## 2016-11-11 ENCOUNTER — Other Ambulatory Visit: Payer: Self-pay | Admitting: Oncology

## 2016-11-11 DIAGNOSIS — Z9889 Other specified postprocedural states: Secondary | ICD-10-CM

## 2016-12-31 ENCOUNTER — Ambulatory Visit
Admission: RE | Admit: 2016-12-31 | Discharge: 2016-12-31 | Disposition: A | Discharge status: Home / Self Care | Payer: Medicare Other | Source: Ambulatory Visit | Attending: Oncology | Admitting: Oncology

## 2016-12-31 DIAGNOSIS — Z9889 Other specified postprocedural states: Secondary | ICD-10-CM

## 2016-12-31 DIAGNOSIS — R922 Inconclusive mammogram: Secondary | ICD-10-CM | POA: Diagnosis not present

## 2017-01-13 DIAGNOSIS — H02402 Unspecified ptosis of left eyelid: Secondary | ICD-10-CM | POA: Diagnosis not present

## 2017-01-13 DIAGNOSIS — H04123 Dry eye syndrome of bilateral lacrimal glands: Secondary | ICD-10-CM | POA: Diagnosis not present

## 2017-01-13 DIAGNOSIS — H40013 Open angle with borderline findings, low risk, bilateral: Secondary | ICD-10-CM | POA: Diagnosis not present

## 2017-01-31 DIAGNOSIS — Z23 Encounter for immunization: Secondary | ICD-10-CM | POA: Diagnosis not present

## 2017-05-07 DIAGNOSIS — Z85828 Personal history of other malignant neoplasm of skin: Secondary | ICD-10-CM | POA: Diagnosis not present

## 2017-05-07 DIAGNOSIS — D229 Melanocytic nevi, unspecified: Secondary | ICD-10-CM | POA: Diagnosis not present

## 2017-05-07 DIAGNOSIS — L821 Other seborrheic keratosis: Secondary | ICD-10-CM | POA: Diagnosis not present

## 2017-06-02 ENCOUNTER — Ambulatory Visit: Payer: Medicare Other | Admitting: Oncology

## 2017-06-02 NOTE — Progress Notes (Signed)
ID: [name]   DOB: [birth year]  MR#: [id number]  GOT#:[id number]  PCP: Maury Dus, MD GYN: Arvella Nigh SU: Osborn Coho OTHER MD: Lavonna Monarch  HISTORY OF PRESENT ILLNESS: From the original intake note:  [name] had neglected her health maintenance for the last several years, partly because she has been so concerned about her sister who died from pancreatic cancer this past March. At any rate, recently she was putting on her bra and noticed a change in the lateral aspect of her right breast. She brought it to Dr. Noland Fordyce attention, and she was set up for bilateral diagnostic mammography at the breast center November 21, 2010. This found her breasts to be heterogeneously dense, and in the upper outer quadrant there was an irregular mass measuring approximately 2.7 cm.   On physical exam, Dr. Karlton Lemon was able to palpate a soft mass measuring approximately 2 cm which on ultrasound had irregular borders and was hypoechoic. It measured 1.7 cm sonographically. Evaluation of the right axilla was negative.   On the same day, the patient proceeded to ultrasound-guided core biopsy, and the pathology from this procedure ([id number]) showed an invasive mammary carcinoma with histiocytoid features, triple negative, with a proliferation marker by MIB-1 of 8%.   [name] case was discussed at the multidisciplinary breast cancer conference on September 26th. The cells were certainly unusual looking with a significant amount of cytoplasmic vacuolation. The fact that she has one of the so called "low-grade triple negative tumors" led to much discussion, and the feeling was that chemotherapy was not automatic in this case but certainly needed to be considered.    The patient underwent right lumpectomy and sentinel lymph node sampling on 01/03/2011 for a T2N0, stage II breast carcinoma.  She decided to forgo chemotherapy. She received radiation therapy, completed mid December of 2012. Her subsequent history  is as detailed below.  INTERVAL HISTORY: Sussan returns today for followup of her estrogen receptor negative breast cancer.  She was concerned because she had developed pain in the right breast.  This was not accompanied by swelling or erythema.  However by the time she made her appointment here the pain was already beginning to go away and it is now minimal.  Since her last visit to the office, she underwent diagnostic bilateral mammography with tomography at [facility] on 12/31/2016 with results showing: Breast density category C. No evidence of new or recurrent breast carcinoma. Benign postsurgical changes on the right.    This is the 93 month anniversary of her son's death and we discussed that at length today  REVIEW OF SYSTEMS: [name] reports right breast soreness worsened with movement x 2 weeks that has mildly resolved at this time. She didn't have right breast pain without movement. She denies redness or swelling, but she notes that there was a change in the breast that felt solid and has resolved mildly at this time. She notes that for exercise, she is . She denies unusual headaches, visual changes, nausea, vomiting, or dizziness. There has been no unusual cough, phlegm production, or pleurisy. This been no change in bowel or bladder habits. She denies unexplained fatigue or unexplained weight loss, bleeding, rash, or fever. A detailed review of systems was otherwise stable.    PAST MEDICAL HISTORY: Past Medical History:  Diagnosis Date  . Breast cancer, IDC, Right, UIQ, Stage II, triple negative 11/21/2010   Invasive ductal carcinoma with histiocytic features.Lumpectomy &SLN on 01/03/11. Grade II tumor, 2.3 cm, ER 0%,  PR 0%, Her 2 -, Ki76 8%. One SLN negative. Declined chemo.Radiatiion:TREATMENT DATES: 02/13/2011 through 03/13/2011  SITE/DOSE: Right breast 4250 cGy 17 sessions. Right breast boost 750 cGy 3 sessions  BEAMS/ENERGY: 6 MV photons tangential fields to the right breast. 15 MEV  electrons, rig  . Depression    mild  . Frequency of urination   . Heart palpitations occasional -- avoids caffine  . History of breast cancer DX 11-21-2011--  S/P LUMPECTOMY AND RADIATION -- FOLLOWED BY DR Griffith Citron  . History of hypotension   . Hyperlipidemia   . Nocturia     PAST SURGICAL HISTORY: Past Surgical History:  Procedure Laterality Date  . BREAST LUMPECTOMY W/ NEEDLE LOCALIZATION  01/03/2011   Right, w SLN Dr Margot Chimes  . BREAST SURGERY  1962   removal benign left breast tumor   . CYSTOSCOPY  03/12/2012   Procedure: CYSTOSCOPY;  Surgeon: Darlyn Chamber, MD;  Location: [facility];  Service: Gynecology;  Laterality: N/A;  . HYSTEROSCOPY W/ ENDOMETRIAL ABLATION  1980  . LAPAROSCOPIC BILATERAL SALPINGO OOPHERECTOMY  03/12/2012   Procedure: LAPAROSCOPIC BILATERAL SALPINGO OOPHORECTOMY;  Surgeon: Darlyn Chamber, MD;  Location: [facility];  Service: Gynecology;  Laterality: Bilateral;    FAMILY HISTORY Family History  Problem Relation Age of Onset  . Cancer Mother        stomach  . Cancer Maternal Uncle        colon  . Cancer Sister        pancreatic  There is no history of breast cancer in the family. There is no history of ovarian cancer in the family to her knowledge.   GYNECOLOGIC HISTORY: Menarche at age 53, last period in 7. She is GX, P4, first live birth at age 62. She never used hormones or birth control pills. 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 Her daughter, Lehman Prom, lives in [place], [place]. 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 Daughter, Jacquelene Kopecky, lives in [place], and she has a Brewing technologist in social work working primarily with prisoners. Son Laban Emperor  Slaven lives in [place]. He is a Administrator. Son Lashawndra Lampkins, also lived in [place] but died in 05-17-16. The patient attends [employer]. She has 6 grandchildren.      ADVANCED DIRECTIVES: Not in place  HEALTH MAINTENANCE: Social History   Tobacco Use  . Smoking status: Former Smoker    Packs/day: 1.00    Years: 20.00    Pack years: 20.00    Types: Cigarettes    Last attempt to quit: 03/11/1979    Years since quitting: 38.2  . Smokeless tobacco: Never Used  Substance Use Topics  . Alcohol use: No  . Drug use: No     Colonoscopy: 2009-05-17, [facility]  PAP: approx 05-17-08  Bone density: May 18, 2011, osteoporosis  Lipid panel:   Allergies  Allergen Reactions  . Contrast Media [Iodinated Diagnostic Agents] Anaphylaxis    AREA INFLAMMED AND SWELLING W/ BREAST NEEDLE LOCALIZATION  . Penicillins Other (See Comments)    Patient does not know of reaction. Was based on a scratch test.    Current Outpatient Medications  Medication Sig Dispense Refill  . pravastatin (PRAVACHOL) 40 MG tablet Take 40  mg by mouth every evening.      No current facility-administered medications for this visit.     OBJECTIVE: Middle-aged white woman who appears stated age  32:   06/03/17 0942 06/03/17 0946  BP: 99/66 (!) 84/49  Pulse: 73   Resp: 18   Temp: 97.7 F (36.5 C)   SpO2: 99%      Body mass index is 21.82 kg/m.    ECOG FS: 0 Filed Weights   06/03/17 0942  Weight: 131 lb 1.6 oz (59.5 kg)   Sclerae unicteric, pupils round and equal Oropharynx clear and moist No cervical or supraclavicular adenopathy Lungs no rales or rhonchi Heart regular rate and rhythm Abd soft, nontender, positive bowel sounds MSK no focal spinal tenderness, no right upper extremity lymphedema Neuro: nonfocal, well oriented, appropriate affect Breasts: The right breast is status post lumpectomy followed by radiation.  There is no swelling or erythema.  There is mild tenderness anteriorly, over the  areola, and beneath the prior scar.  There is no palpable mass.  The left breast is unremarkable.  Both axillae are benign.  LAB RESULTS: Lab Results  Component Value Date   WBC 5.1 01/04/2016   NEUTROABS 3.2 01/04/2016   HGB 13.3 01/04/2016   HCT 39.6 01/04/2016   MCV 91.2 01/04/2016   PLT 180 01/04/2016      Chemistry      Component Value Date/Time   NA 143 01/04/2016 1303   K 4.2 01/04/2016 1303   CL 106 05/19/2012 1052   CO2 24 01/04/2016 1303   BUN 17.7 01/04/2016 1303   CREATININE 1.0 01/04/2016 1303      Component Value Date/Time   CALCIUM 9.4 01/04/2016 1303   ALKPHOS 72 01/04/2016 1303   AST 15 01/04/2016 1303   ALT 16 01/04/2016 1303   BILITOT 0.95 01/04/2016 1303       STUDIES: She underwent diagnostic bilateral mammography with tomography at [facility] on 12/31/2016 with results showing: Breast density category C. No evidence of new or recurrent breast carcinoma. Benign postsurgical changes on the right.     ASSESSMENT: 77 y.o.  BRCA negative [place] woman of Ashkenazi Jewish ancestry  (1)  status post right Breast upper inner quadrant lumpectomy and sentinel lymph node sampling 01/03/2011 for a T2 N0, stage IIA breast cancer with histiocytoid features, triple negative, grade 2, with an MIB-1 of 8%, the patient deciding to forgo chemotherapy  (2)  status post radiation completed mid December 2012  (3) status post bilateral salpingo-oophorectomy 03/12/2012 with benign pathology   PLAN: [name] is now 6-1/2 years out from definitive surgery for breast cancer with no evidence of disease recurrence.  This is very favorable.  I think the pain she was having in the right breast most likely was due to fat necrosis.  That appears to be resolving with no intervention.  We left it that if the pain recurs or new pain develops we will set her up for right mammography and ultrasonography.  At present I do not think that is necessary and I reassured her that I do  not see any evidence of cancer at present.  She is appropriately mourning for her son  She knows to call for any other issues that may develop.  We are not making a routine return appointment for her here but will be glad to see her as needed.  Atari Novick, Virgie Dad, MD  06/03/17 10:07 AM Medical Oncology and Hematology [facility] [address]  [place], [place] [address] Tel. [phone]    Fax. [phone]    This document serves as a record of services personally performed by Lurline Del, MD. It was created on his behalf by Steva Colder, a trained medical scribe. The creation of this record is based on the scribe's personal observations and the provider's statements to them.   I have reviewed the above documentation for accuracy and completeness, and I agree with the above.

## 2017-06-03 ENCOUNTER — Inpatient Hospital Stay: Payer: Medicare Other | Attending: Oncology | Admitting: Oncology

## 2017-06-03 VITALS — BP 84/49 | HR 73 | Temp 97.7°F | Resp 18 | Ht 65.0 in | Wt 131.1 lb

## 2017-06-03 DIAGNOSIS — Z87891 Personal history of nicotine dependence: Secondary | ICD-10-CM | POA: Insufficient documentation

## 2017-06-03 DIAGNOSIS — C50211 Malignant neoplasm of upper-inner quadrant of right female breast: Secondary | ICD-10-CM

## 2017-06-03 DIAGNOSIS — Z171 Estrogen receptor negative status [ER-]: Secondary | ICD-10-CM

## 2017-06-03 DIAGNOSIS — E785 Hyperlipidemia, unspecified: Secondary | ICD-10-CM | POA: Insufficient documentation

## 2017-06-03 DIAGNOSIS — Z923 Personal history of irradiation: Secondary | ICD-10-CM | POA: Insufficient documentation

## 2017-06-03 DIAGNOSIS — Z853 Personal history of malignant neoplasm of breast: Secondary | ICD-10-CM | POA: Insufficient documentation

## 2017-06-03 DIAGNOSIS — Z79899 Other long term (current) drug therapy: Secondary | ICD-10-CM | POA: Diagnosis not present

## 2017-06-03 DIAGNOSIS — F329 Major depressive disorder, single episode, unspecified: Secondary | ICD-10-CM | POA: Insufficient documentation

## 2017-06-04 ENCOUNTER — Telehealth: Payer: Self-pay | Admitting: Oncology

## 2017-06-04 NOTE — Telephone Encounter (Signed)
Per 3/5 no los °

## 2017-06-27 DIAGNOSIS — H02402 Unspecified ptosis of left eyelid: Secondary | ICD-10-CM | POA: Diagnosis not present

## 2017-06-27 DIAGNOSIS — H04123 Dry eye syndrome of bilateral lacrimal glands: Secondary | ICD-10-CM | POA: Diagnosis not present

## 2017-06-27 DIAGNOSIS — H40013 Open angle with borderline findings, low risk, bilateral: Secondary | ICD-10-CM | POA: Diagnosis not present

## 2017-06-27 DIAGNOSIS — H2513 Age-related nuclear cataract, bilateral: Secondary | ICD-10-CM | POA: Diagnosis not present

## 2017-06-27 DIAGNOSIS — Q141 Congenital malformation of retina: Secondary | ICD-10-CM | POA: Diagnosis not present

## 2017-11-17 ENCOUNTER — Other Ambulatory Visit: Payer: Self-pay | Admitting: Oncology

## 2017-11-17 DIAGNOSIS — Z1231 Encounter for screening mammogram for malignant neoplasm of breast: Secondary | ICD-10-CM

## 2017-12-12 DIAGNOSIS — H2513 Age-related nuclear cataract, bilateral: Secondary | ICD-10-CM | POA: Diagnosis not present

## 2017-12-12 DIAGNOSIS — H04123 Dry eye syndrome of bilateral lacrimal glands: Secondary | ICD-10-CM | POA: Diagnosis not present

## 2017-12-12 DIAGNOSIS — H02402 Unspecified ptosis of left eyelid: Secondary | ICD-10-CM | POA: Diagnosis not present

## 2017-12-12 DIAGNOSIS — H40013 Open angle with borderline findings, low risk, bilateral: Secondary | ICD-10-CM | POA: Diagnosis not present

## 2018-01-01 ENCOUNTER — Other Ambulatory Visit: Payer: Self-pay | Admitting: Oncology

## 2018-01-01 DIAGNOSIS — Z853 Personal history of malignant neoplasm of breast: Secondary | ICD-10-CM

## 2018-01-02 ENCOUNTER — Other Ambulatory Visit: Payer: Self-pay | Admitting: Adult Health

## 2018-01-02 DIAGNOSIS — Z853 Personal history of malignant neoplasm of breast: Secondary | ICD-10-CM

## 2018-01-05 ENCOUNTER — Ambulatory Visit
Admission: RE | Admit: 2018-01-05 | Discharge: 2018-01-05 | Disposition: A | Discharge status: Home / Self Care | Payer: Medicare Other | Source: Ambulatory Visit | Attending: Oncology | Admitting: Oncology

## 2018-01-05 ENCOUNTER — Other Ambulatory Visit: Payer: Self-pay | Admitting: Adult Health

## 2018-01-05 ENCOUNTER — Ambulatory Visit
Admission: RE | Admit: 2018-01-05 | Discharge: 2018-01-05 | Disposition: A | Discharge status: Home / Self Care | Payer: Medicare Other | Source: Ambulatory Visit | Attending: Adult Health | Admitting: Adult Health

## 2018-01-05 DIAGNOSIS — Z1231 Encounter for screening mammogram for malignant neoplasm of breast: Secondary | ICD-10-CM

## 2018-01-05 DIAGNOSIS — Z853 Personal history of malignant neoplasm of breast: Secondary | ICD-10-CM

## 2018-01-05 HISTORY — DX: Personal history of irradiation: Z92.3

## 2018-01-05 HISTORY — DX: Malignant neoplasm of unspecified site of unspecified female breast: C50.919

## 2018-04-03 DIAGNOSIS — Z23 Encounter for immunization: Secondary | ICD-10-CM | POA: Diagnosis not present

## 2018-09-22 DIAGNOSIS — H00012 Hordeolum externum right lower eyelid: Secondary | ICD-10-CM | POA: Diagnosis not present

## 2018-12-30 ENCOUNTER — Other Ambulatory Visit: Payer: Self-pay | Admitting: Family Medicine

## 2018-12-30 DIAGNOSIS — Z1231 Encounter for screening mammogram for malignant neoplasm of breast: Secondary | ICD-10-CM

## 2019-01-21 DIAGNOSIS — Z23 Encounter for immunization: Secondary | ICD-10-CM | POA: Diagnosis not present

## 2019-02-15 ENCOUNTER — Other Ambulatory Visit: Payer: Self-pay

## 2019-02-15 ENCOUNTER — Ambulatory Visit
Admission: RE | Admit: 2019-02-15 | Discharge: 2019-02-15 | Disposition: A | Discharge status: Home / Self Care | Payer: Medicare Other | Source: Ambulatory Visit | Attending: Family Medicine | Admitting: Family Medicine

## 2019-02-15 DIAGNOSIS — Z1231 Encounter for screening mammogram for malignant neoplasm of breast: Secondary | ICD-10-CM | POA: Diagnosis not present

## 2019-04-21 ENCOUNTER — Ambulatory Visit: Payer: Medicare Other | Attending: Internal Medicine

## 2019-04-21 DIAGNOSIS — Z23 Encounter for immunization: Secondary | ICD-10-CM | POA: Insufficient documentation

## 2019-04-21 NOTE — Progress Notes (Signed)
   Covid-19 Vaccination Clinic  Name:  [name]    MRN: [id number] DOB: May 23, 1940  04/21/2019  [name] was observed post Covid-19 immunization for 15 minutes without incidence. She was provided with Vaccine Information Sheet and instruction to access the V-Safe system.   [name] was instructed to call 911 with any severe reactions post vaccine: Marland Kitchen Difficulty breathing  . Swelling of your face and throat  . A fast heartbeat  . A bad rash all over your body  . Dizziness and weakness    Immunizations Administered    Name Date Dose VIS Date Route   Pfizer COVID-19 Vaccine 04/21/2019  9:13 AM 0.3 mL 03/12/2019 Intramuscular   Manufacturer: [place]   Lot: [id number]   [place]: [id number]

## 2019-05-10 ENCOUNTER — Ambulatory Visit: Payer: Medicare Other | Attending: Internal Medicine

## 2019-05-10 DIAGNOSIS — Z23 Encounter for immunization: Secondary | ICD-10-CM | POA: Insufficient documentation

## 2019-05-10 NOTE — Progress Notes (Signed)
   Covid-19 Vaccination Clinic  Name:  [name]    MRN: [id number] DOB: 1940/09/09  05/10/2019  [name] was observed post Covid-19 immunization for 15 minutes without incidence. She was provided with Vaccine Information Sheet and instruction to access the V-Safe system.   [name] was instructed to call 911 with any severe reactions post vaccine: Marland Kitchen Difficulty breathing  . Swelling of your face and throat  . A fast heartbeat  . A bad rash all over your body  . Dizziness and weakness    Immunizations Administered    Name Date Dose VIS Date Route   Pfizer COVID-19 Vaccine 05/10/2019 10:05 AM 0.3 mL 03/12/2019 Intramuscular   Manufacturer: [place]   Lot: [id number]   [place]: [id number]

## 2019-06-08 DIAGNOSIS — E860 Dehydration: Secondary | ICD-10-CM | POA: Diagnosis not present

## 2019-10-27 DIAGNOSIS — R32 Unspecified urinary incontinence: Secondary | ICD-10-CM | POA: Diagnosis not present

## 2019-10-27 DIAGNOSIS — R159 Full incontinence of feces: Secondary | ICD-10-CM | POA: Diagnosis not present

## 2019-10-27 DIAGNOSIS — R490 Dysphonia: Secondary | ICD-10-CM | POA: Diagnosis not present

## 2019-10-27 DIAGNOSIS — N3281 Overactive bladder: Secondary | ICD-10-CM | POA: Diagnosis not present

## 2019-11-12 ENCOUNTER — Encounter: Payer: Self-pay | Admitting: Genetic Counselor

## 2019-12-07 DIAGNOSIS — N3281 Overactive bladder: Secondary | ICD-10-CM | POA: Diagnosis not present

## 2019-12-07 DIAGNOSIS — N3941 Urge incontinence: Secondary | ICD-10-CM | POA: Diagnosis not present

## 2019-12-14 DIAGNOSIS — N3943 Post-void dribbling: Secondary | ICD-10-CM | POA: Diagnosis not present

## 2019-12-14 DIAGNOSIS — M62838 Other muscle spasm: Secondary | ICD-10-CM | POA: Diagnosis not present

## 2019-12-14 DIAGNOSIS — N3941 Urge incontinence: Secondary | ICD-10-CM | POA: Diagnosis not present

## 2019-12-14 DIAGNOSIS — M6281 Muscle weakness (generalized): Secondary | ICD-10-CM | POA: Diagnosis not present

## 2019-12-14 DIAGNOSIS — M6289 Other specified disorders of muscle: Secondary | ICD-10-CM | POA: Diagnosis not present

## 2019-12-14 DIAGNOSIS — R3915 Urgency of urination: Secondary | ICD-10-CM | POA: Diagnosis not present

## 2019-12-14 DIAGNOSIS — R350 Frequency of micturition: Secondary | ICD-10-CM | POA: Diagnosis not present

## 2020-01-07 ENCOUNTER — Other Ambulatory Visit: Payer: Self-pay | Admitting: Family Medicine

## 2020-01-07 DIAGNOSIS — Z1231 Encounter for screening mammogram for malignant neoplasm of breast: Secondary | ICD-10-CM

## 2020-01-12 DIAGNOSIS — Z23 Encounter for immunization: Secondary | ICD-10-CM | POA: Diagnosis not present

## 2020-02-16 ENCOUNTER — Ambulatory Visit: Payer: Medicare Other

## 2020-02-23 ENCOUNTER — Ambulatory Visit: Payer: Medicare Other

## 2020-03-28 ENCOUNTER — Other Ambulatory Visit: Payer: Self-pay

## 2020-03-28 ENCOUNTER — Ambulatory Visit
Admission: RE | Admit: 2020-03-28 | Discharge: 2020-03-28 | Disposition: A | Discharge status: Home / Self Care | Payer: Medicare Other | Source: Ambulatory Visit | Attending: Family Medicine | Admitting: Family Medicine

## 2020-03-28 DIAGNOSIS — Z1231 Encounter for screening mammogram for malignant neoplasm of breast: Secondary | ICD-10-CM

## 2020-04-25 DIAGNOSIS — N3281 Overactive bladder: Secondary | ICD-10-CM | POA: Diagnosis not present

## 2020-04-25 DIAGNOSIS — N3941 Urge incontinence: Secondary | ICD-10-CM | POA: Diagnosis not present

## 2020-05-02 DIAGNOSIS — R151 Fecal smearing: Secondary | ICD-10-CM | POA: Diagnosis not present

## 2020-05-02 DIAGNOSIS — R350 Frequency of micturition: Secondary | ICD-10-CM | POA: Diagnosis not present

## 2020-05-02 DIAGNOSIS — M6281 Muscle weakness (generalized): Secondary | ICD-10-CM | POA: Diagnosis not present

## 2020-05-02 DIAGNOSIS — R3915 Urgency of urination: Secondary | ICD-10-CM | POA: Diagnosis not present

## 2020-05-02 DIAGNOSIS — N3941 Urge incontinence: Secondary | ICD-10-CM | POA: Diagnosis not present

## 2020-05-02 DIAGNOSIS — M6289 Other specified disorders of muscle: Secondary | ICD-10-CM | POA: Diagnosis not present

## 2020-05-08 DIAGNOSIS — R350 Frequency of micturition: Secondary | ICD-10-CM | POA: Diagnosis not present

## 2020-05-08 DIAGNOSIS — N3941 Urge incontinence: Secondary | ICD-10-CM | POA: Diagnosis not present

## 2020-05-08 DIAGNOSIS — M6289 Other specified disorders of muscle: Secondary | ICD-10-CM | POA: Diagnosis not present

## 2020-05-08 DIAGNOSIS — R3915 Urgency of urination: Secondary | ICD-10-CM | POA: Diagnosis not present

## 2020-05-08 DIAGNOSIS — R151 Fecal smearing: Secondary | ICD-10-CM | POA: Diagnosis not present

## 2020-05-08 DIAGNOSIS — M6281 Muscle weakness (generalized): Secondary | ICD-10-CM | POA: Diagnosis not present

## 2020-05-22 ENCOUNTER — Other Ambulatory Visit: Payer: Self-pay

## 2020-05-22 ENCOUNTER — Encounter: Payer: Self-pay | Admitting: Dermatology

## 2020-05-22 ENCOUNTER — Ambulatory Visit (INDEPENDENT_AMBULATORY_CARE_PROVIDER_SITE_OTHER): Payer: Medicare Other | Admitting: Dermatology

## 2020-05-22 DIAGNOSIS — C44521 Squamous cell carcinoma of skin of breast: Secondary | ICD-10-CM

## 2020-05-22 DIAGNOSIS — Z1283 Encounter for screening for malignant neoplasm of skin: Secondary | ICD-10-CM

## 2020-05-22 DIAGNOSIS — D485 Neoplasm of uncertain behavior of skin: Secondary | ICD-10-CM

## 2020-05-22 DIAGNOSIS — C44529 Squamous cell carcinoma of skin of other part of trunk: Secondary | ICD-10-CM

## 2020-05-22 NOTE — Patient Instructions (Signed)

## 2020-05-29 ENCOUNTER — Encounter: Payer: Self-pay | Admitting: Dermatology

## 2020-05-30 NOTE — Progress Notes (Addendum)
   New Patient   Subjective  [name] is a 80 y.o. female who presents for the following: Annual Exam (Right chest- x 1 year- + itch).  New growth on chest plus in need of general skin examination. Location:  Duration:  Quality:  Associated Signs/Symptoms: Modifying Factors:  Severity:  Timing: Context:    The following portions of the chart were reviewed this encounter and updated as appropriate:  Tobacco  Allergies  Meds  Problems  Med Hx  Surg Hx  Fam Hx      Objective  Well appearing patient in no apparent distress; mood and affect are within normal limits. Objective  Left Abdomen (side) - Lower: Full body skin examination-no atypical pigmented lesions.  1 possible nonmole skin cancer right chest which will be biopsied.  Objective  Right Breast: Four millimeter pink pearly papule at the superior margin of a thickened tan scale.      A full examination was performed including scalp, head, eyes, ears, nose, lips, neck, chest, axillae, abdomen, back, buttocks, bilateral upper extremities, bilateral lower extremities, hands, feet, fingers, toes, fingernails, and toenails. All findings within normal limits unless otherwise noted below.  Areas beneath undergarments not examined.   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  Skin / nail biopsy Type of biopsy: tangential   Informed consent: discussed and consent obtained   Timeout: patient name, date of birth, surgical site, and procedure verified   Procedure prep:  Patient was prepped and draped in usual sterile fashion (Non sterile) Prep type:  Chlorhexidine Anesthesia: the lesion was anesthetized in a standard fashion   Anesthetic:  1% lidocaine w/ epinephrine 1-100,000 local infiltration Instrument used: flexible razor blade   Outcome: patient tolerated  procedure well   Post-procedure details: wound care instructions given    Destruction of lesion Complexity: simple   Destruction method: electrodesiccation and curettage   Informed consent: discussed and consent obtained   Timeout:  patient name, date of birth, surgical site, and procedure verified Anesthesia: the lesion was anesthetized in a standard fashion   Anesthetic:  1% lidocaine w/ epinephrine 1-100,000 local infiltration Curettage performed in three different directions: Yes   Curettage cycles:  3 Lesion length (cm):  0.8 Lesion width (cm):  0.8 Margin per side (cm):  0 Final wound size (cm):  0.8 Hemostasis achieved with:  aluminum chloride Outcome: patient tolerated procedure well with no complications   Post-procedure details: wound care instructions given    Specimen 1 - Surgical pathology Differential Diagnosis: bcc vs scc  Check Margins: No  After shave biopsy focal deeper keratin was treated with curettage plus cautery.

## 2020-06-02 NOTE — Progress Notes (Signed)
I, Lavonna Monarch, MD, have reviewed all documentation for this visit. The documentation on 06/02/20 for the exam, diagnosis, procedures, and orders are all accurate and complete.

## 2020-09-29 DIAGNOSIS — E782 Mixed hyperlipidemia: Secondary | ICD-10-CM | POA: Diagnosis not present

## 2020-09-29 DIAGNOSIS — M810 Age-related osteoporosis without current pathological fracture: Secondary | ICD-10-CM | POA: Diagnosis not present

## 2020-09-29 DIAGNOSIS — I959 Hypotension, unspecified: Secondary | ICD-10-CM | POA: Diagnosis not present

## 2020-09-29 DIAGNOSIS — F4321 Adjustment disorder with depressed mood: Secondary | ICD-10-CM | POA: Diagnosis not present

## 2020-10-05 ENCOUNTER — Encounter: Payer: Self-pay | Admitting: Cardiovascular Disease

## 2020-10-05 ENCOUNTER — Ambulatory Visit (INDEPENDENT_AMBULATORY_CARE_PROVIDER_SITE_OTHER): Payer: Medicare Other | Admitting: Cardiovascular Disease

## 2020-10-05 ENCOUNTER — Other Ambulatory Visit: Payer: Self-pay

## 2020-10-05 VITALS — BP 86/54 | HR 69 | Ht 65.0 in | Wt 132.0 lb

## 2020-10-05 DIAGNOSIS — R0989 Other specified symptoms and signs involving the circulatory and respiratory systems: Secondary | ICD-10-CM

## 2020-10-05 DIAGNOSIS — Z853 Personal history of malignant neoplasm of breast: Secondary | ICD-10-CM

## 2020-10-05 DIAGNOSIS — F1010 Alcohol abuse, uncomplicated: Secondary | ICD-10-CM

## 2020-10-05 DIAGNOSIS — I951 Orthostatic hypotension: Secondary | ICD-10-CM

## 2020-10-05 DIAGNOSIS — E7800 Pure hypercholesterolemia, unspecified: Secondary | ICD-10-CM

## 2020-10-05 MED ORDER — MIDODRINE HCL 2.5 MG PO TABS: 2.5000 mg | ORAL_TABLET | Freq: Three times a day (TID) | ORAL | 0 refills | Status: AC

## 2020-10-05 NOTE — Patient Instructions (Addendum)
Medication Instructions:  START midodrine 2.5 mg three times daily (breakfast, lunch, 5pm-do not take after 6)  *If you need a refill on your cardiac medications before your next appointment, please call your pharmacy*  Testing/Procedures: Your physician has requested that you have a carotid duplex. This test is an ultrasound of the carotid arteries in your neck. It looks at blood flow through these arteries that supply the brain with blood. Allow one hour for this exam. There are no restrictions or special instructions.  Your physician has requested that you have a upper extremity arterial duplex. This test is an ultrasound of the arteries in the arms. It looks at arterial blood flow in the arms. Allow one hour for Upper Arterial scans. There are no restrictions or special instructions  Your physician has requested that you have an echocardiogram. Echocardiography is a painless test that uses sound waves to create images of your heart. It provides your doctor with information about the size and shape of your heart and how well your heart's chambers and valves are working. This procedure takes approximately one hour. There are no restrictions for this procedure. This will be done at our [facility] location:  [place]: At [facility], you and your health needs are our priority.  As part of our continuing mission to provide you with exceptional heart care, we have created designated Provider Care Teams.  These Care Teams include your primary Cardiologist (physician) and Advanced Practice Providers (APPs -  Physician Assistants and Nurse Practitioners) who all work together to provide you with the care you need, when you need it.  We recommend signing up for the patient portal called "MyChart".  Sign up information is provided on this After Visit Summary.  MyChart is used to connect with patients for Virtual Visits (Telemedicine).  Patients are able to view  lab/test results, encounter notes, upcoming appointments, etc.  Non-urgent messages can be sent to your provider as well.   To learn more about what you can do with MyChart, go to [email or web address].    Your next appointment:   2 months with Dr. Claiborne Billings     How to Use Compression Stockings Compression stockings are elastic socks that squeeze the legs. They help increase blood flow (circulation) to the legs, decrease swelling in the legs, and reduce the chance of developing blood clots in the lower legs. Compression stockings are often used by people who: Are recovering from surgery. Have poor circulation in their legs. Tend to get blood clots in their legs. Have bulging (varicose) veins. Sit or stay in bed for long periods of time. Follow instructions from your health care provider about how and when to wearyour compression stockings. How to wear compression stockings Before you put on your compression stockings: Make sure that they are the correct size and degree of compression. If you do not know your size or required grade of compression, ask your health care provider and follow the manufacturer's instructions that come with the stockings. Make sure that they are clean, dry, and in good condition. Check them for rips and tears. Do not put them on if they are ripped or torn. Put your stockings on first thing in the morning, before you get out of bed. Keep them on for as long as your health care provider advises. When you are wearing your stockings: Keep them as smooth as possible. Do not allow them to bunch up. It is especially important to prevent the  stockings from bunching up around your toes or behind your knees. Do not roll the stockings downward and leave them rolled down. This can decrease blood flow to your leg. Change them right away if they become wet or dirty. When you take off your stockings, inspect your legs and feet. Check for: Open sores. Red spots. Swelling. General  tips Do not stop wearing compression stockings without talking to your health care provider first. Wash your stockings every day with mild detergent in cold or warm water. Do not use bleach. Air-dry your stockings or dry them in a clothes dryer on low heat. It may be helpful to have two pairs so that you have a pair to wear while the other is being washed. Replace your stockings every 3-6 months. If skin moisturizing is part of your treatment plan, apply lotion or cream at night so that your skin will be dry when you put on the stockings in the morning. It is harder to put the stockings on when you have lotion on your legs or feet. Wear nonskid shoes or slip-resistant socks when walking while wearing compression stockings. Contact a health care provider and remove your stockings if you have: A feeling of pins and needles in your feet or legs. Open sores, red spots, or other skin changes on your feet or legs. Swelling or pain that gets worse. Get help right away if you have: Numbness or tingling in your lower legs that does not get better right after you take the stockings off. Toes or feet that are unusually cold or turn a bluish color. A warm or red area on your leg. New swelling or soreness in your leg. Shortness of breath. Chest pain. A fast or irregular heartbeat. Light-headedness. Dizziness. Summary Compression stockings are elastic socks that squeeze the legs. They help increase blood flow (circulation) to the legs, decrease swelling in the legs, and reduce the chance of developing blood clots in the lower legs. Follow instructions from your health care provider about how and when to wear your compression stockings. Do not stop wearing your compression stockings without talking to your health care provider first. This information is not intended to replace advice given to you by your health care provider. Make sure you discuss any questions you have with your healthcare  provider. Document Revised: 07/21/2019 Document Reviewed: 07/21/2019 Elsevier Patient Education  2022 [employer].

## 2020-10-05 NOTE — Progress Notes (Signed)
Cardiology Office Note    Date:  10/10/2020   ID:  [name], DOB 1940-06-16, MRN [id number]  PCP:  Maury Dus, MD  Cardiologist:  Shelva Majestic, MD   New cardiology evaluation referred through the courtesy of Dr. Fayne Norrie, PA for evaluation of low blood pressure.  History of Present Illness:  [name] is a 80 y.o. female who admits to having low blood pressure for most of her life.  She has been evaluated by Dr. Dierdre Forth and most recently by Marilynne Drivers, PA for significant low blood pressure often in the 70s over 40s.  Apparently the patient has been noticing her blood pressure may run from 44-01 systolically and at times has diastolics in the 02V 25D and 60s.  During some of her significant low blood pressure episodes she has become transiently dizzy and nauseated.  She was recently seen by Marilynne Drivers, PA. and there was concerns that despite her low blood pressure, her heart rate was not increasing appropriately.  During her evaluation, her blood pressure was 70/50 and her pulse was 73.  She denied chest pain,shortness of breath, abdominal pain, bloody or black tarry stools, as well as vaginal bleeding.  She states she often drinks 5 glasses of water per day.  She has remote history of breast cancer  She also has a history of osteoporosis and had been treated in the past with alendronate and 8, calcium and vitamin D.  She has had issues of depression and lost 20 pounds when her son unfortunately committed suicide 4 years ago.  He had significant issues with drugs and alcohol which ultimately led to cirrhosis.  She has remote history of EtOH abuse but has been dry since her supplier sobriety date of July 06, 1983.  She continues to participate in alcohol Anonymous and remains active.  She took her blood pressure this morning before eating breakfast and her recording was 69/51.  She presents for evaluation.   Past Medical History:  Diagnosis Date   Breast cancer  [facility]) 2012   Right Breast Cancer   Breast cancer, IDC, Right, UIQ, Stage II, triple negative 11/21/2010   Invasive ductal carcinoma with histiocytic features.Lumpectomy &SLN on 01/03/11. Grade II tumor, 2.3 cm, ER 0%, PR 0%, Her 2 -, Ki76 8%. One SLN negative. Declined chemo.Radiatiion:TREATMENT DATES: 02/13/2011 through 03/13/2011  SITE/DOSE: Right breast 4250 cGy 17 sessions. Right breast boost 750 cGy 3 sessions  BEAMS/ENERGY: 6 MV photons tangential fields to the right breast. 15 MEV electrons, rig   Depression    mild   Frequency of urination    Heart palpitations occasional -- avoids caffine   History of breast cancer DX 11-21-2011--  S/P LUMPECTOMY AND RADIATION -- FOLLOWED BY DR Griffith Citron   History of hypotension    Hyperlipidemia    Nocturia    Personal history of radiation therapy 2012   Right Breast Cancer    Past Surgical History:  Procedure Laterality Date   BREAST LUMPECTOMY Right 2012   BREAST LUMPECTOMY W/ NEEDLE LOCALIZATION  01/03/2011   Right, w SLN Dr Margot Chimes   BREAST SURGERY  1962   removal benign left breast tumor    CYSTOSCOPY  03/12/2012   Procedure: CYSTOSCOPY;  Surgeon: Darlyn Chamber, MD;  Location: [facility];  Service: Gynecology;  Laterality: N/A;   HYSTEROSCOPY W/ ENDOMETRIAL ABLATION  1980   LAPAROSCOPIC BILATERAL SALPINGO OOPHERECTOMY  03/12/2012   Procedure: LAPAROSCOPIC BILATERAL SALPINGO OOPHORECTOMY;  Surgeon: Jenny Reichmann  Sherran Needs, MD;  Location: [facility];  Service: Gynecology;  Laterality: Bilateral;    Current Medications: Outpatient Medications Prior to Visit  Medication Sig Dispense Refill   pravastatin (PRAVACHOL) 40 MG tablet Take 40 mg by mouth every evening.     No facility-administered medications prior to visit.     Allergies:   Contrast media [iodinated diagnostic agents] and Penicillins   Social History   Socioeconomic History   Marital status: Divorced    Spouse name: Not on file   Number of children:  Not on file   Years of education: Not on file   Highest education level: Not on file  Occupational History   Not on file  Tobacco Use   Smoking status: Former    Packs/day: 1.00    Years: 20.00    Pack years: 20.00    Types: Cigarettes    Quit date: 03/11/1979    Years since quitting: 41.6   Smokeless tobacco: Never  Substance and Sexual Activity   Alcohol use: No   Drug use: No   Sexual activity: Not on file  Other Topics Concern   Not on file  Social History Narrative   Not on file   Social Determinants of Health   Financial Resource Strain: Not on file  Food Insecurity: Not on file  Transportation Needs: Not on file  Physical Activity: Not on file  Stress: Not on file  Social Connections: Not on file     She was born in [place].  She started smoking at age 83 and quit at age 89 has been quit since 1980.  She was married for 18 years and is divorced.  She lives by herself.  There is a prior alcohol history but her sobriety date is July 06, 1983 and she is an active member in alcoholic Anonymous for 37 years.  She had 4 children and her son died on 30-Jul-2016 after developing cirrhosis from alcoholism and committed suicide.  Family History:  The patient's  family history includes Cancer in her maternal uncle, mother, and sister.  Her mother died at age 36 with stomach cancer.  Her father died at age 77 and had pneumonia and alcoholism.  A sister died at age 28 with pancreatic cancer.  ROS General: Negative; No fevers, chills, or night sweats;  HEENT: Negative; No changes in vision or hearing, sinus congestion, difficulty swallowing Pulmonary: Negative; No cough, wheezing, shortness of breath, hemoptysis Cardiovascular: See HPI GI: Negative; No nausea, vomiting, diarrhea, or abdominal pain GU: Negative; No dysuria, hematuria, or difficulty voiding Musculoskeletal: Negative; no myalgias, joint pain, or weakness Hematologic/Oncology: History of breast cancer,  status postlumpectomy Endocrine: Negative; no heat/cold intolerance; no diabetes Neuro: Negative; no changes in balance, headaches Skin: Negative; No rashes or skin lesions Psychiatric: Reactive depression when her son died in 2016-08-25 Sleep: Negative; No snoring, daytime sleepiness, hypersomnolence, bruxism, restless legs, hypnogognic hallucinations, no cataplexy Other comprehensive 14 point system review is negative.   PHYSICAL EXAM:   VS:  BP (!) 86/54   Pulse 69   Ht 5' 5" (1.651 m)   Wt 132 lb (59.9 kg)   BMI 21.97 kg/m     Supine blood pressure by me in the right arm 118/68; in the left arm 102/60. Standing blood pressure in the right arm 84/56 and in the left arm 64/46.   Wt Readings from Last 3 Encounters:  10/05/20 132 lb (59.9 kg)  06/03/17 131 lb 1.6  oz (59.5 kg)  01/11/16 140 lb 14.4 oz (63.9 kg)    General: Alert, oriented, no distress.  Skin: normal turgor, no rashes, warm and dry HEENT: Normocephalic, atraumatic. Pupils equal round and reactive to light; sclera anicteric; extraocular muscles intact;  Nose without nasal septal hypertrophy Mouth/Parynx benign; Mallinpatti scale 3 Neck: No JVD, no carotid bruits; normal carotid upstroke Lungs: clear to ausculatation and percussion; no wheezing or rales Chest wall: without tenderness to palpitation Heart: PMI not displaced, RRR, s1 s2 normal, 1/6 systolic murmur, no diastolic murmur, no rubs, gallops, thrills, or heaves Abdomen: soft, nontender; no hepatosplenomehaly, BS+; abdominal aorta nontender and not dilated by palpation. Back: no CVA tenderness Pulses 2+ Musculoskeletal: full range of motion, normal strength, no joint deformities Extremities: no clubbing cyanosis or edema, Homan's sign negative  Neurologic: grossly nonfocal; Cranial nerves grossly wnl Psychologic: Normal mood and affect   Studies/Labs Reviewed:   EKG:  EKG is ordered today.  ECG (independently read by me): Normal sinus rhythm at 69  bpm.  No ectopy.  Nonspecific ST-T changes.  Normal intervals.  Recent Labs: BMP Latest Ref Rng & Units 01/04/2016 12/19/2014 12/13/2013  Glucose 70 - 140 mg/dl 109 103 80  BUN 7.0 - 26.0 mg/dL 17.7 16.8 12.7  Creatinine 0.6 - 1.1 mg/dL 1.0 1.0 0.9  Sodium 136 - 145 mEq/L 143 141 142  Potassium 3.5 - 5.1 mEq/L 4.2 4.3 4.3  Chloride 98 - 107 mEq/L - - -  CO2 22 - 29 mEq/L _0 Calcium 8.4 - 10.4 mg/dL 9.4 9.0 9.5     Hepatic Function Latest Ref Rng & Units 01/04/2016 12/19/2014 12/13/2013  Total Protein 6.4 - 8.3 g/dL 6.7 5.9(L) 7.2  Albumin 3.5 - 5.0 g/dL 3.5 3.2(L) 3.8  AST 5 - 34 U/L _1 ALT 0 - 55 U/L _2 Alk Phosphatase 40 - 150 U/L 72 64 61  Total Bilirubin 0.20 - 1.20 mg/dL 0.95 0.86 1.18  Bilirubin, Direct 0.0 - 0.3 mg/dL - - -    CBC Latest Ref Rng & Units 01/04/2016 12/19/2014 12/13/2013  WBC 3.9 - 10.3 10e3/uL 5.1 6.3 4.2  Hemoglobin 11.6 - 15.9 g/dL 13.3 13.3 13.7  Hematocrit 34.8 - 46.6 % 39.6 40.3 40.1  Platelets 145 - 400 10e3/uL 180 213 173   Lab Results  Component Value Date   MCV 91.2 01/04/2016   MCV 91.6 12/19/2014   MCV 90.3 12/13/2013   No results found for: TSH No results found for: HGBA1C   BNP No results found for: BNP  ProBNP No results found for: PROBNP   Lipid Panel  No results found for: CHOL, TRIG, HDL, CHOLHDL, VLDL, LDLCALC, LDLDIRECT, LABVLDL   RADIOLOGY: VAS [place] DOP BILAT COMP TOS DIGITS REYNAUD  Result Date: 10/07/2020 [place] STUDY Patient Name:  Maha Fischel Segel  Date of Exam:   10/06/2020 Medical Rec #: [id number]       Accession #:    [id number] Date of Birth: 06-Sep-1940       Patient Gender: F Patient Age:   60Y Exam Location:  [facility] Procedure:      VAS UE DOPPLER BILAT/COMP TOS, DIGITS (TO&UE REYNAUDS) Referring Phys: [id number] THOMAS A KELLY --------------------------------------------------------------------------------  Indications: Unequal blood pressure in upper extremities. Lower pressure noted               in the left arm per patient. She denies any arm claudication  symptoms or rest pain.  Risk Factors: Hyperlipidemia, past history of smoking. Comparison Study: NA Performing Technologist: Sharlett Iles RVT  Examination Guidelines: A complete evaluation includes B-mode imaging, spectral Doppler, color Doppler, and power Doppler as needed of all accessible portions of each vessel. Bilateral testing is considered an integral part of a complete examination. Limited examinations for reoccurring indications may be performed as noted.  Right Doppler Findings: +--------+--------+-----+---------+--------+ Site    PressureIndexDoppler  Comments +--------+--------+-----+---------+--------+ [id number]          triphasic         +--------+--------+-----+---------+--------+ Radial  151     1.07 triphasic         +--------+--------+-----+---------+--------+ Ulnar   146     1.04 triphasic         +--------+--------+-----+---------+--------+ Digit   144     1.02 normal            +--------+--------+-----+---------+--------+  +---+----+  WBI1.07  +---+----+ Left Doppler Findings: +--------+--------+-----+---------+--------+ Site    PressureIndexDoppler  Comments +--------+--------+-----+---------+--------+ [id number]          triphasic         +--------+--------+-----+---------+--------+ Radial  143     1.01 triphasic         +--------+--------+-----+---------+--------+ Ulnar   140     0.99 triphasic         +--------+--------+-----+---------+--------+ Digit   154     1.09 normal            +--------+--------+-----+---------+--------+  +---+----+  WBI1.01  +---+----+  Summary:  Right: No significant arterial obstruction detected in the right        upper extremity. Left: No significant arterial obstruction detected in the left upper       extremity. *See table(s) above for measurements and observations. Electronically signed by Ida Rogue MD on  10/07/2020 at 10:31:35 PM.    Final    VAS US CAROTID  Result Date: 10/07/2020 Carotid Arterial Duplex Study Patient Name:  JELANI VREELAND  Date of Exam:   10/06/2020 Medical Rec #: [id number]       Accession #:    [id number] Date of Birth: [birth year]       Patient Gender: F Patient Age:   58Y Exam Location:  [facility] Procedure:      VAS US CAROTID Referring Phys: [place] --------------------------------------------------------------------------------  Indications:       Unequal blood pressure in upper extremities. Lower pressure                    noted in the left arm per patient. She denies any                    cerebrovascular symptoms. Risk Factors:      Hyperlipidemia, past history of smoking. Comparison Study:  NA Performing Technologist: Sharlett Iles RVT  Examination Guidelines: A complete evaluation includes B-mode imaging, spectral Doppler, color Doppler, and power Doppler as needed of all accessible portions of each vessel. Bilateral testing is considered an integral part of a complete examination. Limited examinations for reoccurring indications may be performed as noted.  Right Carotid Findings: +----------+--------+--------+--------+------------------+--------+           PSV cm/sEDV cm/sStenosisPlaque DescriptionComments +----------+--------+--------+--------+------------------+--------+ CCA Prox  59      13                                         +----------+--------+--------+--------+------------------+--------+  CCA Distal48      17                                         +----------+--------+--------+--------+------------------+--------+ ICA Prox  43      11      Normal                             +----------+--------+--------+--------+------------------+--------+ ICA Mid   51      15                                         +----------+--------+--------+--------+------------------+--------+ ICA Distal89      29                                          +----------+--------+--------+--------+------------------+--------+ ECA       62      10                                         +----------+--------+--------+--------+------------------+--------+ +----------+--------+-------+----------------+-------------------+           PSV cm/sEDV cmsDescribe        Arm Pressure (mmHG) +----------+--------+-------+----------------+-------------------+ [id number]             Multiphasic, [id number]                 +----------+--------+-------+----------------+-------------------+ +---------+--------+--+--------+--+---------+ VertebralPSV cm/s37EDV cm/s11Antegrade +---------+--------+--+--------+--+---------+  Left Carotid Findings: +----------+--------+--------+--------+------------------+--------+           PSV cm/sEDV cm/sStenosisPlaque DescriptionComments +----------+--------+--------+--------+------------------+--------+ CCA Prox  81      18                                         +----------+--------+--------+--------+------------------+--------+ CCA Distal61      14                                         +----------+--------+--------+--------+------------------+--------+ ICA Prox  48      12      Normal                             +----------+--------+--------+--------+------------------+--------+ ICA Mid   56      22                                         +----------+--------+--------+--------+------------------+--------+ ICA Distal71      26                                         +----------+--------+--------+--------+------------------+--------+ ECA       53      10                                         +----------+--------+--------+--------+------------------+--------+ +----------+--------+--------+----------------+-------------------+  PSV cm/sEDV cm/sDescribe        Arm Pressure (mmHG) +----------+--------+--------+----------------+-------------------+ [id number]               Multiphasic, SPQ330                 +----------+--------+--------+----------------+-------------------+ +---------+--------+--+--------+-+---------+ VertebralPSV cm/s36EDV cm/s9Antegrade +---------+--------+--+--------+-+---------+   Summary: Right Carotid: There is no evidence of stenosis in the right ICA. There was no                evidence of thrombus, dissection, atherosclerotic plaque or                stenosis in the cervical carotid system. Left Carotid: There is no evidence of stenosis in the left ICA. There was no               evidence of thrombus, dissection, atherosclerotic plaque or               stenosis in the cervical carotid system. Vertebrals:  Bilateral vertebral arteries demonstrate antegrade flow. Subclavians: Normal flow hemodynamics were seen in bilateral subclavian              arteries. Incidental findings: There is an enlarged thyroid nodule noted in the mid to lower pole, bilaterally. The right measures 1.9 x 1.9 x 2.2 cm and the left measures 1.8 x 1.3 x 2.1 cm. These nodules could represent a goiter. Recommend dedicated thyroid ultrasound. *See table(s) above for measurements and observations.  Electronically signed by Ida Rogue MD on 10/07/2020 at 10:32:39 PM.    Final      Additional studies/ records that were reviewed today include:  I reviewed the records of [facility]    ASSESSMENT:    1. Orthostatic hypotension   2. Unequal blood pressure in upper extremities   3. Pure hypercholesterolemia   4. History of breast cancer in adulthood   5. Remote history of alcohol abuse; Sobriety date July 06, 1983     PLAN:  Ms. Donnisha Besecker is a very pleasant 80 year old female who has a remote history of tobacco use (quit in 1980, as well as alcoholism, sobriety date July 06, 1983.  She admits to having low blood pressure for most of her life.  However, over the past several months, her blood pressure seems to be getting lower and at times she has had recorded  blood pressures in the 70s to 07M with diastolic blood pressures in the upper 40s to 60s.  She denies any frank syncope or associated chest pain.  She denies any bleeding.  She is unaware of significant episodes of tachycardia or palpitations.  On physical exam she has blood pressure disparity between her right and left arm.  On my blood pressure reading supine her right arm blood pressure was 118/68 and her left arm blood pressure was 102/60.  She was orthostatic and her left arm blood pressure dropped to 64/46 with standing.  She states she typically takes her blood pressure in her left arm.  I have recommended she undergo a 2D echo Doppler study to assess systolic and diastolic function as well as valvular architecture.  I did not hear any significant carotid bruits.  With her blood pressure differential is certainly possible she may have subclavian artery or upper extremity PVD.  I am scheduling her to undergo carotid duplex imaging as well as bilateral upper extremity Doppler assessment.  With her orthostatic blood pressure drop I have recommended support stockings initially with 20 to 30 mm of  pressure support.  With her orthostatic drop, I also have recommended initiation of low-dose midodrine 2.5 mg 3 times daily to take at breakfast, lunch and approximately 5 PM.  I have recommended she monitor her blood pressure in both arms.  She has a history of hyperlipidemia treated with pravastatin.  LDL cholesterol in September 29, 2020 was 122.  I will see her in 2 months for follow-up evaluation.   Medication Adjustments/Labs and Tests Ordered: Current medicines are reviewed at length with the patient today.  Concerns regarding medicines are outlined above.  Medication changes, Labs and Tests ordered today are listed in the Patient Instructions below. Patient Instructions  Medication Instructions:  START midodrine 2.5 mg three times daily (breakfast, lunch, 5pm-do not take after 6)  *If you need a refill on your  cardiac medications before your next appointment, please call your pharmacy*  Testing/Procedures: Your physician has requested that you have a carotid duplex. This test is an ultrasound of the carotid arteries in your neck. It looks at blood flow through these arteries that supply the brain with blood. Allow one hour for this exam. There are no restrictions or special instructions.  Your physician has requested that you have a upper extremity arterial duplex. This test is an ultrasound of the arteries in the arms. It looks at arterial blood flow in the arms. Allow one hour for Upper Arterial scans. There are no restrictions or special instructions  Your physician has requested that you have an echocardiogram. Echocardiography is a painless test that uses sound waves to create images of your heart. It provides your doctor with information about the size and shape of your heart and how well your heart's chambers and valves are working. This procedure takes approximately one hour. There are no restrictions for this procedure. This will be done at our [facility] location:  [place]: At [facility], you and your health needs are our priority.  As part of our continuing mission to provide you with exceptional heart care, we have created designated Provider Care Teams.  These Care Teams include your primary Cardiologist (physician) and Advanced Practice Providers (APPs -  Physician Assistants and Nurse Practitioners) who all work together to provide you with the care you need, when you need it.  We recommend signing up for the patient portal called "MyChart".  Sign up information is provided on this After Visit Summary.  MyChart is used to connect with patients for Virtual Visits (Telemedicine).  Patients are able to view lab/test results, encounter notes, upcoming appointments, etc.  Non-urgent messages can be sent to your provider as well.   To learn more about what  you can do with MyChart, go to [email or web address].    Your next appointment:   2 months with Dr. Claiborne Billings     How to Use Compression Stockings Compression stockings are elastic socks that squeeze the legs. They help increase blood flow (circulation) to the legs, decrease swelling in the legs, and reduce the chance of developing blood clots in the lower legs. Compression stockings are often used by people who: Are recovering from surgery. Have poor circulation in their legs. Tend to get blood clots in their legs. Have bulging (varicose) veins. Sit or stay in bed for long periods of time. Follow instructions from your health care provider about how and when to wearyour compression stockings. How to wear compression stockings Before you put on your compression stockings: Make sure that they are the  correct size and degree of compression. If you do not know your size or required grade of compression, ask your health care provider and follow the manufacturer's instructions that come with the stockings. Make sure that they are clean, dry, and in good condition. Check them for rips and tears. Do not put them on if they are ripped or torn. Put your stockings on first thing in the morning, before you get out of bed. Keep them on for as long as your health care provider advises. When you are wearing your stockings: Keep them as smooth as possible. Do not allow them to bunch up. It is especially important to prevent the stockings from bunching up around your toes or behind your knees. Do not roll the stockings downward and leave them rolled down. This can decrease blood flow to your leg. Change them right away if they become wet or dirty. When you take off your stockings, inspect your legs and feet. Check for: Open sores. Red spots. Swelling. General tips Do not stop wearing compression stockings without talking to your health care provider first. Wash your stockings every day with mild  detergent in cold or warm water. Do not use bleach. Air-dry your stockings or dry them in a clothes dryer on low heat. It may be helpful to have two pairs so that you have a pair to wear while the other is being washed. Replace your stockings every 3-6 months. If skin moisturizing is part of your treatment plan, apply lotion or cream at night so that your skin will be dry when you put on the stockings in the morning. It is harder to put the stockings on when you have lotion on your legs or feet. Wear nonskid shoes or slip-resistant socks when walking while wearing compression stockings. Contact a health care provider and remove your stockings if you have: A feeling of pins and needles in your feet or legs. Open sores, red spots, or other skin changes on your feet or legs. Swelling or pain that gets worse. Get help right away if you have: Numbness or tingling in your lower legs that does not get better right after you take the stockings off. Toes or feet that are unusually cold or turn a bluish color. A warm or red area on your leg. New swelling or soreness in your leg. Shortness of breath. Chest pain. A fast or irregular heartbeat. Light-headedness. Dizziness. Summary Compression stockings are elastic socks that squeeze the legs. They help increase blood flow (circulation) to the legs, decrease swelling in the legs, and reduce the chance of developing blood clots in the lower legs. Follow instructions from your health care provider about how and when to wear your compression stockings. Do not stop wearing your compression stockings without talking to your health care provider first. This information is not intended to replace advice given to you by your health care provider. Make sure you discuss any questions you have with your healthcare provider. Document Revised: 07/21/2019 Document Reviewed: 07/21/2019 Elsevier Patient Education  [address]   Signed, Shelva Majestic, MD   10/10/2020 4:49 PM    [facility] [address], [place], [place], [place]  [address] Phone: [phone]

## 2020-10-06 ENCOUNTER — Other Ambulatory Visit: Payer: Self-pay | Admitting: Cardiovascular Disease

## 2020-10-06 ENCOUNTER — Ambulatory Visit (HOSPITAL_COMMUNITY)
Admission: RE | Admit: 2020-10-06 | Discharge: 2020-10-06 | Disposition: A | Discharge status: Home / Self Care | Payer: Medicare Other | Source: Ambulatory Visit | Attending: Cardiology | Admitting: Cardiology

## 2020-10-06 ENCOUNTER — Ambulatory Visit (HOSPITAL_BASED_OUTPATIENT_CLINIC_OR_DEPARTMENT_OTHER)
Admission: RE | Admit: 2020-10-06 | Discharge: 2020-10-06 | Disposition: A | Discharge status: Home / Self Care | Payer: Medicare Other | Source: Ambulatory Visit | Attending: Cardiovascular Disease | Admitting: Cardiovascular Disease

## 2020-10-06 DIAGNOSIS — R0989 Other specified symptoms and signs involving the circulatory and respiratory systems: Secondary | ICD-10-CM

## 2020-10-06 DIAGNOSIS — I951 Orthostatic hypotension: Secondary | ICD-10-CM

## 2020-10-10 ENCOUNTER — Telehealth: Payer: Self-pay | Admitting: Cardiovascular Disease

## 2020-10-10 ENCOUNTER — Encounter: Payer: Self-pay | Admitting: Cardiovascular Disease

## 2020-10-10 DIAGNOSIS — H40013 Open angle with borderline findings, low risk, bilateral: Secondary | ICD-10-CM | POA: Diagnosis not present

## 2020-10-10 NOTE — Telephone Encounter (Signed)
Pt is calling to confirm the additional testing for her thyroid.

## 2020-10-10 NOTE — Telephone Encounter (Signed)
Pt calling to discuss her carotid US findings. She is concerned with the incidental findings of: "Incidental findings: There is an enlarged thyroid nodule noted in the mid to lower pole, bilaterally. The right measures 1.9 x 1.9 x 2.2 cm and the  left measures 1.8 x 1.3 x 2.1 cm. These nodules could represent a goiter."  She is concerned because she has not heard back from Dr. Claiborne Billings regarding a referral for this. We discussed how reports are released to patients prior to the MD reviewing them, and he may have not been able to review just yet.  I will send her request to him and NL triage for follow up.

## 2020-10-11 NOTE — Telephone Encounter (Signed)
Tried to call pt VM says pt has not set up VM. We will try again later

## 2020-10-13 NOTE — Telephone Encounter (Signed)
Received call from patient who is inquiring about her recent Carotid results. Patient states that's she is concerned about the thyroid mass found as she viewed results on MyChart already. Patient would like to know if Dr. Claiborne Billings had any further recommendations regarding these results or further testing needed.   Advised patient that results have not been reviewed by Dr. Claiborne Billings yet as he is out of office but will return next week. Advised patient that I would forward message to Dr. Claiborne Billings for him to review and advise. Patient verbalized understanding.

## 2020-10-16 NOTE — Telephone Encounter (Signed)
This RN spoke with Dr.Kelly, Dr. Claiborne Billings further clarified in his result note for pt's carotid results. Attempted to call pt with result, call went to voicemail. Left message for pt to call office back.

## 2020-10-16 NOTE — Telephone Encounter (Signed)
Patient returning call.

## 2020-10-16 NOTE — Telephone Encounter (Signed)
This RN returned pt's call, reviewed results (see result notes 7/18). This RN routed results of carotid to pt's PCP to follow up with Dr. Evette Georges recommendations. Pt verbalized understanding.

## 2020-10-19 DIAGNOSIS — E041 Nontoxic single thyroid nodule: Secondary | ICD-10-CM | POA: Diagnosis not present

## 2020-10-19 DIAGNOSIS — E785 Hyperlipidemia, unspecified: Secondary | ICD-10-CM | POA: Diagnosis not present

## 2020-10-19 DIAGNOSIS — Z6822 Body mass index (BMI) 22.0-22.9, adult: Secondary | ICD-10-CM | POA: Diagnosis not present

## 2020-10-19 DIAGNOSIS — Z853 Personal history of malignant neoplasm of breast: Secondary | ICD-10-CM | POA: Diagnosis not present

## 2020-10-19 DIAGNOSIS — E042 Nontoxic multinodular goiter: Secondary | ICD-10-CM | POA: Diagnosis not present

## 2020-10-24 ENCOUNTER — Other Ambulatory Visit: Payer: Self-pay

## 2020-10-24 ENCOUNTER — Other Ambulatory Visit: Payer: Self-pay | Admitting: Internal Medicine

## 2020-10-24 ENCOUNTER — Ambulatory Visit (HOSPITAL_COMMUNITY): Payer: Medicare Other | Attending: Internal Medicine

## 2020-10-24 DIAGNOSIS — E041 Nontoxic single thyroid nodule: Secondary | ICD-10-CM

## 2020-10-24 DIAGNOSIS — I951 Orthostatic hypotension: Secondary | ICD-10-CM | POA: Diagnosis not present

## 2020-10-24 LAB — ECHOCARDIOGRAM COMPLETE
Area-P 1/2: 2.46 cm2
S' Lateral: 3.35 cm

## 2020-10-24 MED ORDER — PERFLUTREN LIPID MICROSPHERE
1.0000 mL | INTRAVENOUS | Status: AC | PRN
  Administered 2020-10-24: 1 mL via INTRAVENOUS

## 2020-10-26 ENCOUNTER — Other Ambulatory Visit: Payer: Self-pay | Admitting: Internal Medicine

## 2020-10-26 ENCOUNTER — Ambulatory Visit
Admission: RE | Admit: 2020-10-26 | Discharge: 2020-10-26 | Disposition: A | Discharge status: Home / Self Care | Payer: Self-pay | Source: Ambulatory Visit | Attending: Internal Medicine | Admitting: Internal Medicine

## 2020-10-26 DIAGNOSIS — E041 Nontoxic single thyroid nodule: Secondary | ICD-10-CM

## 2020-10-27 ENCOUNTER — Telehealth: Payer: Self-pay | Admitting: Cardiovascular Disease

## 2020-10-27 NOTE — Telephone Encounter (Signed)
Pt called reporting this morning she felt very fatigue, nauseous, and shaky. She checked BP and it was 70/50. Since then, she has drunk plenty of fluid and right now feels somewhat better. Nurse had pt recheck BP while on the phone and report it is now 105/69.   Pt state she was instructed by MD to call if BP drops. However, pt did admit she did pick up midodrine prescription due to side effects.   Will forward to MD to make aware. Pt also made aware of ED precaution should any new symptoms develop or worsen.

## 2020-10-27 NOTE — Telephone Encounter (Signed)
  Pt c/o BP issue: STAT if pt c/o blurred vision, one-sided weakness or slurred speech  1. What are your last 5 BP readings? 70/50  2. Are you having any other symptoms (ex. Dizziness, headache, blurred vision, passed out)?   3. What is your BP issue?   Pt requesting to speak with Dr. Evette Georges nurse, she said she wanted to speak with her because she felt very sick this morning. She said Dr. Claiborne Billings advised her to call if she gets worst, she asked if nurse can call her as soon as possible

## 2020-10-27 NOTE — Telephone Encounter (Signed)
The medicine that Dr. Claiborne Billings prescribed she forgot to tell [name] that they have taken it off the market. This is why she has been researching because she can no longer take it not because she didn't want you. States she does not need a callback just wanted to inform her.

## 2020-11-21 ENCOUNTER — Other Ambulatory Visit (HOSPITAL_COMMUNITY)
Admission: RE | Admit: 2020-11-21 | Discharge: 2020-11-21 | Disposition: A | Discharge status: Home / Self Care | Payer: Medicare Other | Source: Ambulatory Visit | Attending: Radiology | Admitting: Radiology

## 2020-11-21 ENCOUNTER — Ambulatory Visit
Admission: RE | Admit: 2020-11-21 | Discharge: 2020-11-21 | Disposition: A | Discharge status: Home / Self Care | Payer: Medicare Other | Source: Ambulatory Visit | Attending: Internal Medicine | Admitting: Internal Medicine

## 2020-11-21 ENCOUNTER — Other Ambulatory Visit: Payer: Self-pay

## 2020-11-21 DIAGNOSIS — E041 Nontoxic single thyroid nodule: Secondary | ICD-10-CM

## 2020-11-21 DIAGNOSIS — D34 Benign neoplasm of thyroid gland: Secondary | ICD-10-CM | POA: Diagnosis not present

## 2020-11-22 LAB — CYTOLOGY - NON PAP

## 2020-12-20 ENCOUNTER — Telehealth: Payer: Self-pay | Admitting: Cardiovascular Disease

## 2020-12-20 NOTE — Telephone Encounter (Signed)
Follow Up:     Patient is returning Alisha's call from today.

## 2020-12-20 NOTE — Telephone Encounter (Signed)
Attempted to contact pt to follow up on BP. Left message to call back.

## 2020-12-20 NOTE — Telephone Encounter (Signed)
Please see previous encounter

## 2020-12-20 NOTE — Telephone Encounter (Signed)
Left message to call back  

## 2020-12-20 NOTE — Telephone Encounter (Signed)
Called pt to follow up on BP. Pt state her blood pressure has still been running low and can range from 82-95 systolic. Nurse offered pt an appointment to further assess but pt declined and state she would prefer to be managed by PCP.

## 2020-12-20 NOTE — Telephone Encounter (Signed)
[name] is calling stating she received a VM from [facility] today and is returning her call.

## 2021-01-18 DIAGNOSIS — Z23 Encounter for immunization: Secondary | ICD-10-CM | POA: Diagnosis not present

## 2021-01-30 DIAGNOSIS — Z23 Encounter for immunization: Secondary | ICD-10-CM | POA: Diagnosis not present

## 2021-02-12 ENCOUNTER — Ambulatory Visit: Payer: Medicare Other | Admitting: Cardiovascular Disease

## 2021-02-14 DIAGNOSIS — I959 Hypotension, unspecified: Secondary | ICD-10-CM | POA: Diagnosis not present

## 2021-03-22 DIAGNOSIS — I959 Hypotension, unspecified: Secondary | ICD-10-CM | POA: Diagnosis not present

## 2021-05-25 ENCOUNTER — Other Ambulatory Visit: Payer: Self-pay | Admitting: Family Medicine

## 2021-05-25 DIAGNOSIS — Z1231 Encounter for screening mammogram for malignant neoplasm of breast: Secondary | ICD-10-CM

## 2021-06-07 ENCOUNTER — Ambulatory Visit
Admission: RE | Admit: 2021-06-07 | Discharge: 2021-06-07 | Disposition: A | Discharge status: Home / Self Care | Payer: Medicare Other | Source: Ambulatory Visit | Attending: Family Medicine | Admitting: Family Medicine

## 2021-06-07 DIAGNOSIS — Z1231 Encounter for screening mammogram for malignant neoplasm of breast: Secondary | ICD-10-CM | POA: Diagnosis not present

## 2021-06-19 DIAGNOSIS — Z853 Personal history of malignant neoplasm of breast: Secondary | ICD-10-CM | POA: Diagnosis not present

## 2021-06-19 DIAGNOSIS — Z1211 Encounter for screening for malignant neoplasm of colon: Secondary | ICD-10-CM | POA: Diagnosis not present

## 2021-06-19 DIAGNOSIS — Z86010 Personal history of colonic polyps: Secondary | ICD-10-CM | POA: Diagnosis not present

## 2021-06-19 DIAGNOSIS — Z800 Family history of malignant neoplasm of digestive organs: Secondary | ICD-10-CM | POA: Diagnosis not present

## 2021-07-11 DIAGNOSIS — Z1211 Encounter for screening for malignant neoplasm of colon: Secondary | ICD-10-CM | POA: Diagnosis not present

## 2021-07-11 DIAGNOSIS — Z1212 Encounter for screening for malignant neoplasm of rectum: Secondary | ICD-10-CM | POA: Diagnosis not present

## 2021-12-05 DIAGNOSIS — Z23 Encounter for immunization: Secondary | ICD-10-CM | POA: Diagnosis not present

## 2021-12-11 DIAGNOSIS — E782 Mixed hyperlipidemia: Secondary | ICD-10-CM | POA: Diagnosis not present

## 2021-12-27 ENCOUNTER — Ambulatory Visit
Admission: RE | Admit: 2021-12-27 | Discharge: 2021-12-27 | Disposition: A | Discharge status: Home / Self Care | Payer: Medicare Other | Source: Ambulatory Visit | Attending: Family Medicine | Admitting: Family Medicine

## 2021-12-27 ENCOUNTER — Other Ambulatory Visit: Payer: Self-pay | Admitting: Family Medicine

## 2021-12-27 DIAGNOSIS — M542 Cervicalgia: Secondary | ICD-10-CM | POA: Diagnosis not present

## 2021-12-31 DIAGNOSIS — M542 Cervicalgia: Secondary | ICD-10-CM | POA: Diagnosis not present

## 2022-01-08 DIAGNOSIS — E860 Dehydration: Secondary | ICD-10-CM | POA: Diagnosis not present

## 2022-01-11 DIAGNOSIS — M542 Cervicalgia: Secondary | ICD-10-CM | POA: Diagnosis not present

## 2022-01-21 DIAGNOSIS — H2513 Age-related nuclear cataract, bilateral: Secondary | ICD-10-CM | POA: Diagnosis not present

## 2022-01-21 DIAGNOSIS — H35033 Hypertensive retinopathy, bilateral: Secondary | ICD-10-CM | POA: Diagnosis not present

## 2022-01-21 DIAGNOSIS — H524 Presbyopia: Secondary | ICD-10-CM | POA: Diagnosis not present

## 2022-01-21 DIAGNOSIS — H40013 Open angle with borderline findings, low risk, bilateral: Secondary | ICD-10-CM | POA: Diagnosis not present

## 2022-04-08 DIAGNOSIS — J069 Acute upper respiratory infection, unspecified: Secondary | ICD-10-CM | POA: Diagnosis not present

## 2022-04-08 DIAGNOSIS — R058 Other specified cough: Secondary | ICD-10-CM | POA: Diagnosis not present

## 2022-04-08 DIAGNOSIS — Z801 Family history of malignant neoplasm of trachea, bronchus and lung: Secondary | ICD-10-CM | POA: Diagnosis not present

## 2022-04-08 DIAGNOSIS — R0981 Nasal congestion: Secondary | ICD-10-CM | POA: Diagnosis not present

## 2022-04-09 ENCOUNTER — Ambulatory Visit
Admission: RE | Admit: 2022-04-09 | Discharge: 2022-04-09 | Disposition: A | Discharge status: Home / Self Care | Payer: Medicare Other | Source: Ambulatory Visit | Attending: Family Medicine | Admitting: Family Medicine

## 2022-04-09 ENCOUNTER — Other Ambulatory Visit: Payer: Self-pay | Admitting: Family Medicine

## 2022-04-09 DIAGNOSIS — R058 Other specified cough: Secondary | ICD-10-CM

## 2022-04-09 DIAGNOSIS — R059 Cough, unspecified: Secondary | ICD-10-CM | POA: Diagnosis not present

## 2022-04-11 DIAGNOSIS — H40013 Open angle with borderline findings, low risk, bilateral: Secondary | ICD-10-CM | POA: Diagnosis not present

## 2022-04-24 ENCOUNTER — Other Ambulatory Visit (HOSPITAL_BASED_OUTPATIENT_CLINIC_OR_DEPARTMENT_OTHER): Payer: Self-pay

## 2022-04-24 DIAGNOSIS — C50211 Malignant neoplasm of upper-inner quadrant of right female breast: Secondary | ICD-10-CM

## 2022-04-24 DIAGNOSIS — R059 Cough, unspecified: Secondary | ICD-10-CM

## 2022-04-25 ENCOUNTER — Ambulatory Visit (INDEPENDENT_AMBULATORY_CARE_PROVIDER_SITE_OTHER): Payer: Medicare Other | Admitting: Internal Medicine

## 2022-04-25 DIAGNOSIS — R059 Cough, unspecified: Secondary | ICD-10-CM

## 2022-04-25 DIAGNOSIS — C50211 Malignant neoplasm of upper-inner quadrant of right female breast: Secondary | ICD-10-CM

## 2022-04-25 LAB — PULMONARY FUNCTION TEST
DL/VA % pred: 72 %
DL/VA: 2.94 ml/min/mmHg/L
DLCO cor % pred: 79 %
DLCO cor: 15.42 ml/min/mmHg
DLCO unc % pred: 79 %
DLCO unc: 15.42 ml/min/mmHg
FEF 25-75 Post: 1.81 L/sec
FEF 25-75 Pre: 1.69 L/sec
FEF2575-%Change-Post: 6 %
FEF2575-%Pred-Post: 123 %
FEF2575-%Pred-Pre: 115 %
FEV1-%Change-Post: 0 %
FEV1-%Pred-Post: 120 %
FEV1-%Pred-Pre: 120 %
FEV1-Post: 2.46 L
FEV1-Pre: 2.46 L
FEV1FVC-%Change-Post: 0 %
FEV1FVC-%Pred-Pre: 98 %
FEV6-%Change-Post: -1 %
FEV6-%Pred-Post: 126 %
FEV6-%Pred-Pre: 128 %
FEV6-Post: 3.28 L
FEV6-Pre: 3.33 L
FEV6FVC-%Change-Post: 1 %
FEV6FVC-%Pred-Post: 105 %
FEV6FVC-%Pred-Pre: 103 %
FVC-%Change-Post: 0 %
FVC-%Pred-Post: 123 %
FVC-%Pred-Pre: 123 %
FVC-Post: 3.37 L
FVC-Pre: 3.38 L
Post FEV1/FVC ratio: 73 %
Post FEV6/FVC ratio: 100 %
Pre FEV1/FVC ratio: 73 %
Pre FEV6/FVC Ratio: 98 %
RV % pred: 125 %
RV: 3.05 L
TLC % pred: 125 %
TLC: 6.52 L

## 2022-04-25 NOTE — Progress Notes (Signed)
Full PFT Performed Today  

## 2022-04-25 NOTE — Patient Instructions (Signed)
Full PFT Performed Today  

## 2022-05-13 ENCOUNTER — Telehealth (HOSPITAL_BASED_OUTPATIENT_CLINIC_OR_DEPARTMENT_OTHER): Payer: Self-pay | Admitting: Internal Medicine

## 2022-05-13 NOTE — Telephone Encounter (Signed)
The pulmonary function test showed mild reduction of gas exchange (Diffusion Capacity) which can be seen with mild emphysema. This test does not detect cancer. I see mention of a history of breast cancer, which does not imply that there is any active cancer now.

## 2022-05-13 NOTE — Telephone Encounter (Signed)
Called and spoke with patient. 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 Patient stated that her PCP told her that those results were a mistake. Patient wants to know if she has cancer or not.   CY, please advise.

## 2022-05-13 NOTE — Telephone Encounter (Signed)
Called and spoke with patient. She verbalized understanding.   Nothing further needed.  

## 2022-05-14 DIAGNOSIS — L820 Inflamed seborrheic keratosis: Secondary | ICD-10-CM | POA: Diagnosis not present

## 2022-05-22 ENCOUNTER — Other Ambulatory Visit: Payer: Self-pay | Admitting: Nurse Practitioner

## 2022-05-22 DIAGNOSIS — E042 Nontoxic multinodular goiter: Secondary | ICD-10-CM | POA: Diagnosis not present

## 2022-05-22 DIAGNOSIS — E785 Hyperlipidemia, unspecified: Secondary | ICD-10-CM | POA: Diagnosis not present

## 2022-05-27 ENCOUNTER — Ambulatory Visit
Admission: RE | Admit: 2022-05-27 | Discharge: 2022-05-27 | Disposition: A | Discharge status: Home / Self Care | Payer: Medicare Other | Source: Ambulatory Visit | Attending: Nurse Practitioner | Admitting: Nurse Practitioner

## 2022-05-27 DIAGNOSIS — E042 Nontoxic multinodular goiter: Secondary | ICD-10-CM

## 2022-07-25 DIAGNOSIS — R21 Rash and other nonspecific skin eruption: Secondary | ICD-10-CM | POA: Diagnosis not present

## 2022-07-25 DIAGNOSIS — T783XXA Angioneurotic edema, initial encounter: Secondary | ICD-10-CM | POA: Diagnosis not present

## 2022-08-01 DIAGNOSIS — L821 Other seborrheic keratosis: Secondary | ICD-10-CM | POA: Diagnosis not present

## 2022-08-01 DIAGNOSIS — Z7189 Other specified counseling: Secondary | ICD-10-CM | POA: Diagnosis not present

## 2022-08-01 DIAGNOSIS — D225 Melanocytic nevi of trunk: Secondary | ICD-10-CM | POA: Diagnosis not present

## 2022-08-01 DIAGNOSIS — Z85828 Personal history of other malignant neoplasm of skin: Secondary | ICD-10-CM | POA: Diagnosis not present

## 2022-08-01 DIAGNOSIS — L814 Other melanin hyperpigmentation: Secondary | ICD-10-CM | POA: Diagnosis not present

## 2022-08-01 DIAGNOSIS — Z08 Encounter for follow-up examination after completed treatment for malignant neoplasm: Secondary | ICD-10-CM | POA: Diagnosis not present

## 2022-08-07 ENCOUNTER — Other Ambulatory Visit: Payer: Self-pay | Admitting: Family Medicine

## 2022-08-07 DIAGNOSIS — Z0000 Encounter for general adult medical examination without abnormal findings: Secondary | ICD-10-CM

## 2022-08-13 ENCOUNTER — Ambulatory Visit: Payer: Medicare Other

## 2022-08-13 ENCOUNTER — Ambulatory Visit
Admission: RE | Admit: 2022-08-13 | Discharge: 2022-08-13 | Disposition: A | Discharge status: Home / Self Care | Payer: Medicare Other | Source: Ambulatory Visit | Attending: Family Medicine | Admitting: Family Medicine

## 2022-08-13 DIAGNOSIS — Z1231 Encounter for screening mammogram for malignant neoplasm of breast: Secondary | ICD-10-CM | POA: Diagnosis not present

## 2022-08-13 DIAGNOSIS — Z0000 Encounter for general adult medical examination without abnormal findings: Secondary | ICD-10-CM

## 2022-11-19 DIAGNOSIS — H04123 Dry eye syndrome of bilateral lacrimal glands: Secondary | ICD-10-CM | POA: Diagnosis not present

## 2022-11-19 DIAGNOSIS — H2513 Age-related nuclear cataract, bilateral: Secondary | ICD-10-CM | POA: Diagnosis not present

## 2022-11-21 DIAGNOSIS — E7849 Other hyperlipidemia: Secondary | ICD-10-CM | POA: Diagnosis not present

## 2022-11-21 DIAGNOSIS — Z853 Personal history of malignant neoplasm of breast: Secondary | ICD-10-CM | POA: Diagnosis not present

## 2022-11-21 DIAGNOSIS — E041 Nontoxic single thyroid nodule: Secondary | ICD-10-CM | POA: Diagnosis not present

## 2022-11-27 DIAGNOSIS — I9589 Other hypotension: Secondary | ICD-10-CM | POA: Diagnosis not present

## 2022-11-27 DIAGNOSIS — Z853 Personal history of malignant neoplasm of breast: Secondary | ICD-10-CM | POA: Diagnosis not present

## 2022-11-27 DIAGNOSIS — E041 Nontoxic single thyroid nodule: Secondary | ICD-10-CM | POA: Diagnosis not present

## 2022-11-27 DIAGNOSIS — L509 Urticaria, unspecified: Secondary | ICD-10-CM | POA: Diagnosis not present

## 2022-12-03 DIAGNOSIS — L509 Urticaria, unspecified: Secondary | ICD-10-CM | POA: Diagnosis not present

## 2022-12-03 DIAGNOSIS — E041 Nontoxic single thyroid nodule: Secondary | ICD-10-CM | POA: Diagnosis not present

## 2022-12-03 DIAGNOSIS — Z853 Personal history of malignant neoplasm of breast: Secondary | ICD-10-CM | POA: Diagnosis not present

## 2022-12-03 DIAGNOSIS — Z91010 Allergy to peanuts: Secondary | ICD-10-CM | POA: Diagnosis not present

## 2022-12-03 DIAGNOSIS — Z91018 Allergy to other foods: Secondary | ICD-10-CM | POA: Diagnosis not present

## 2022-12-03 DIAGNOSIS — Z9109 Other allergy status, other than to drugs and biological substances: Secondary | ICD-10-CM | POA: Diagnosis not present

## 2022-12-03 DIAGNOSIS — E7849 Other hyperlipidemia: Secondary | ICD-10-CM | POA: Diagnosis not present

## 2022-12-03 DIAGNOSIS — I9589 Other hypotension: Secondary | ICD-10-CM | POA: Diagnosis not present

## 2022-12-03 DIAGNOSIS — E2749 Other adrenocortical insufficiency: Secondary | ICD-10-CM | POA: Diagnosis not present
